# Patient Record
Sex: Male | Born: 1950 | Race: White | Hispanic: No | Marital: Married | Smoking: Never smoker
Health system: Southern US, Community
[De-identification: ages and names within clinical notes are randomized; demographics above are authoritative.]

## PROBLEM LIST (undated history)

## (undated) DIAGNOSIS — G44009 Cluster headache syndrome, unspecified, not intractable: Secondary | ICD-10-CM

## (undated) DIAGNOSIS — E611 Iron deficiency: Secondary | ICD-10-CM

## (undated) DIAGNOSIS — N200 Calculus of kidney: Secondary | ICD-10-CM

## (undated) DIAGNOSIS — J189 Pneumonia, unspecified organism: Secondary | ICD-10-CM

## (undated) DIAGNOSIS — I10 Essential (primary) hypertension: Secondary | ICD-10-CM

## (undated) DIAGNOSIS — R351 Nocturia: Secondary | ICD-10-CM

## (undated) DIAGNOSIS — K219 Gastro-esophageal reflux disease without esophagitis: Secondary | ICD-10-CM

## (undated) DIAGNOSIS — Z87442 Personal history of urinary calculi: Secondary | ICD-10-CM

## (undated) DIAGNOSIS — I6521 Occlusion and stenosis of right carotid artery: Secondary | ICD-10-CM

## (undated) DIAGNOSIS — E782 Mixed hyperlipidemia: Secondary | ICD-10-CM

## (undated) DIAGNOSIS — G4730 Sleep apnea, unspecified: Secondary | ICD-10-CM

## (undated) DIAGNOSIS — M1990 Unspecified osteoarthritis, unspecified site: Secondary | ICD-10-CM

## (undated) DIAGNOSIS — Z9889 Other specified postprocedural states: Secondary | ICD-10-CM

## (undated) DIAGNOSIS — R7989 Other specified abnormal findings of blood chemistry: Secondary | ICD-10-CM

## (undated) DIAGNOSIS — C61 Malignant neoplasm of prostate: Secondary | ICD-10-CM

## (undated) DIAGNOSIS — G4733 Obstructive sleep apnea (adult) (pediatric): Secondary | ICD-10-CM

## (undated) DIAGNOSIS — E785 Hyperlipidemia, unspecified: Secondary | ICD-10-CM

## (undated) DIAGNOSIS — E559 Vitamin D deficiency, unspecified: Secondary | ICD-10-CM

## (undated) DIAGNOSIS — I6523 Occlusion and stenosis of bilateral carotid arteries: Secondary | ICD-10-CM

## (undated) HISTORY — PX: COLONOSCOPY: SHX174

## (undated) HISTORY — PX: CYSTOSCOPY/RETROGRADE/URETEROSCOPY/STONE EXTRACTION WITH BASKET: SHX5317

## (undated) HISTORY — DX: Sleep apnea, unspecified: G47.30

## (undated) HISTORY — DX: Essential (primary) hypertension: I10

## (undated) HISTORY — DX: Gastro-esophageal reflux disease without esophagitis: K21.9

## (undated) HISTORY — PX: APPENDECTOMY: SHX54

## (undated) HISTORY — PX: PROSTATE BIOPSY: SHX241

## (undated) HISTORY — PX: UMBILICAL HERNIA REPAIR: SHX196

## (undated) HISTORY — DX: Vitamin D deficiency, unspecified: E55.9

## (undated) HISTORY — PX: INGUINAL HERNIA REPAIR: SHX194

## (undated) HISTORY — DX: Cluster headache syndrome, unspecified, not intractable: G44.009

## (undated) HISTORY — DX: Hyperlipidemia, unspecified: E78.5

## (undated) HISTORY — PX: ANTERIOR CERVICAL DECOMP/DISCECTOMY FUSION: SHX1161

## (undated) HISTORY — DX: Occlusion and stenosis of right carotid artery: I65.21

## (undated) HISTORY — DX: Other specified postprocedural states: Z98.890

---

## 1998-10-17 ENCOUNTER — Ambulatory Visit: Admission: RE | Admit: 1998-10-17 | Discharge: 1998-10-17 | Payer: Self-pay | Admitting: Otolaryngology

## 1998-12-08 ENCOUNTER — Ambulatory Visit (HOSPITAL_COMMUNITY): Admission: RE | Admit: 1998-12-08 | Discharge: 1998-12-08 | Payer: Self-pay | Admitting: Family Medicine

## 1998-12-28 ENCOUNTER — Ambulatory Visit: Admission: RE | Admit: 1998-12-28 | Discharge: 1998-12-28 | Payer: Self-pay | Admitting: Otolaryngology

## 2003-10-29 HISTORY — PX: ANTERIOR CERVICAL DISCECTOMY: SHX1160

## 2003-11-19 ENCOUNTER — Inpatient Hospital Stay (HOSPITAL_COMMUNITY): Admission: RE | Admit: 2003-11-19 | Discharge: 2003-11-20 | Payer: Self-pay | Admitting: Neurosurgery

## 2004-01-14 ENCOUNTER — Ambulatory Visit (HOSPITAL_COMMUNITY): Admission: RE | Admit: 2004-01-14 | Discharge: 2004-01-14 | Payer: Self-pay | Admitting: Gastroenterology

## 2004-01-14 ENCOUNTER — Encounter (INDEPENDENT_AMBULATORY_CARE_PROVIDER_SITE_OTHER): Payer: Self-pay | Admitting: Specialist

## 2004-06-08 ENCOUNTER — Ambulatory Visit (HOSPITAL_COMMUNITY): Admission: RE | Admit: 2004-06-08 | Discharge: 2004-06-08 | Payer: Self-pay | Admitting: Family Medicine

## 2006-09-25 ENCOUNTER — Ambulatory Visit (HOSPITAL_COMMUNITY): Admission: RE | Admit: 2006-09-25 | Discharge: 2006-09-25 | Payer: Self-pay | Admitting: Family Medicine

## 2006-09-25 ENCOUNTER — Ambulatory Visit: Payer: Self-pay | Admitting: Vascular Surgery

## 2006-09-25 ENCOUNTER — Encounter: Payer: Self-pay | Admitting: Vascular Surgery

## 2007-10-29 HISTORY — PX: UMBILICAL HERNIA REPAIR: SHX196

## 2007-11-06 ENCOUNTER — Ambulatory Visit (HOSPITAL_COMMUNITY): Admission: RE | Admit: 2007-11-06 | Discharge: 2007-11-06 | Payer: Self-pay | Admitting: Surgery

## 2008-10-05 ENCOUNTER — Ambulatory Visit (HOSPITAL_BASED_OUTPATIENT_CLINIC_OR_DEPARTMENT_OTHER): Admission: RE | Admit: 2008-10-05 | Discharge: 2008-10-05 | Payer: Self-pay | Admitting: Surgery

## 2008-10-05 HISTORY — PX: INGUINAL HERNIA REPAIR: SHX194

## 2009-12-22 ENCOUNTER — Encounter: Admission: RE | Admit: 2009-12-22 | Discharge: 2009-12-22 | Payer: Self-pay | Admitting: Family Medicine

## 2010-11-09 LAB — BASIC METABOLIC PANEL
CO2: 27 mEq/L (ref 19–32)
Calcium: 9.1 mg/dL (ref 8.4–10.5)
Creatinine, Ser: 0.94 mg/dL (ref 0.4–1.5)
Glucose, Bld: 112 mg/dL — ABNORMAL HIGH (ref 70–99)
Sodium: 142 mEq/L (ref 135–145)

## 2010-11-09 LAB — POCT HEMOGLOBIN-HEMACUE: Hemoglobin: 15.6 g/dL (ref 13.0–17.0)

## 2010-12-12 NOTE — Op Note (Signed)
NAME:  MAZE, CORNIEL NO.:  [id number]   MEDICAL RECORD NO.:  [id number]          PATIENT TYPE:  AMB   LOCATION:  DSC                          FACILITY:  [facility]   PHYSICIAN:  Currie Paris, M.D.DATE OF BIRTH:  01-04-1951   DATE OF PROCEDURE:  10/05/2008  DATE OF DISCHARGE:                               OPERATIVE REPORT   PREOPERATIVE DIAGNOSIS:  Direct right inguinal hernia.   POSTOPERATIVE DIAGNOSIS:  Direct right inguinal hernia.   OPERATION:  Repair of right inguinal hernia with mesh.   SURGEON:  Currie Paris, MD   ANESTHESIA:  General.   CLINICAL HISTORY:  This is a 60 year old gentleman with a recently  developed right inguinal hernia, which was reducible, but fairly large  and he wish to have repaired.   DESCRIPTION OF PROCEDURE:  I saw the patient in the holding area and we  reviewed the plans for the procedure as noted above.  He had no further  questions.  We both initialed the right side as the operative side.   The patient was taken to the operating room and after satisfactory  general anesthesia had been obtained, the groin area was clipped,  prepped, and draped.  The time-out was done.   To help with postop analgesia, I infiltrated both the skin incision line  and the area near the anterior superior iliac spine with some 0.25%  plain Marcaine.   Incision was made deep into the external oblique aponeurosis.  There is  a large amount of tissue bulging through the superficial ring.   I opened the external oblique aponeurosis and freed it up.  I was unable  to elevate the cord structures off of the deeper tissues and surrounded  with a Penrose drain.  There was a large amount of material up into the  sac and as I was stripping this off, I found that he had a large direct  defect with the direct tissue all stuck and involved with the cord, but  once I had that all stripped off, I could see that the defect was all  medial to the  epigastric whichI well able to visualize.  There was no  indirect sac and once I had everything stripped off, I had only the cord  structures coming through the deep ring.   I put more local in the deeper tissues and then used a running 2-0  Prolene to close the transversalis to keep the hernia reduced.  This was  done with no tension.  I then put a piece of Bard mesh in, which was  tapered medially and cut laterally to go around the cord.  This was  anchored to the pubic tubercle and then a running suture of 2-0 Prolene  along the shelving edge until I got to the deep ring.  It was then  tacked well up under the external oblique and onto the internal oblique  with interrupted Prolene.  The tails were crossed and tacked down.  What  I thought was the ilioinguinal nerve came out with the cord structures  and was preserved.  However, it  was difficult to visualize this for  certain.   I put more local in and then closed the external oblique with 3-0  Vicryl, Scarpa's with 3-0 Vicryl, and the skin with 4-0 Monocryl  subcuticular plus Dermabond.   The patient tolerated the procedure well and there were no operative  complications.  All counts were correct.      Currie Paris, M.D.  Electronically Signed     CJS/MEDQ  D:  10/05/2008  T:  10/05/2008  Job:  [id number]

## 2010-12-12 NOTE — Op Note (Signed)
NAME:  Kyle English, BORN NO.:  [id number]   MEDICAL RECORD NO.:  [id number]          PATIENT TYPE:  AMB   LOCATION:  DAY                          FACILITY:  [facility]   PHYSICIAN:  Currie Paris, M.D.DATE OF BIRTH:  1951-01-28   DATE OF PROCEDURE:  11/06/2007  DATE OF DISCHARGE:                               OPERATIVE REPORT   OFFICE MEDICAL:  [facility] [phone].   PREOPERATIVE DIAGNOSIS:  Umbilical hernia.   POSTOPERATIVE DIAGNOSIS:  Umbilical hernia.   OPERATION:  Repair of umbilical hernia.   SURGEON:  Currie Paris, M.D.   ANESTHESIA:  General.   CLINICAL HISTORY:  This is a 60 year old gentleman with a symptomatic  umbilical hernia.   DESCRIPTION OF PROCEDURE:  The patient was seen in the holding area and  had no further questions.  We confirmed umbilical hernia as the planned  procedure.   The patient was taken to the operating room and after satisfactory  general (LMA) anesthesia had been obtained, the abdomen was prepped and  draped.  The time-out was done.   I infiltrated 0.25% plain Marcaine around the umbilical area.  I made a  curvilinear incision at the base the umbilicus and as soon as we got  through the skin, I was into the hernia sac and saw omentum.  This was  cleaned off and the skin elevated off of the fascia circumferentially so  that I could see the defect nicely.  Careful palpation showed no  additional defects.   The incision was then closed by taking some Prolene and closing the  fascial defect with three sutures.  These tied down easily and with no  tension.  I then took about a 1-inch x 1-inch piece of Bard mesh and  threaded the Prolene sutures that I had used to close the fascia through  that and anchored them to that, and then several other Prolenes to  anchor the mesh to the fascia circumferentially to reinforce this.   Everything appeared to be dry.  Additional local was infiltrated as I  worked.  The subcutaneous was  closed with some 3-0 Vicryl and the skin  with a 4-0 Monocryl subcuticular and Dermabond.  The patient tolerated  procedure well.  There no complications.  All counts were correct.      Currie Paris, M.D.  Electronically Signed     CJS/MEDQ  D:  11/06/2007  T:  11/06/2007  Job:  [id number]   cc:   Duncan Dull, M.D.  Fax: [phone]

## 2010-12-15 NOTE — Op Note (Signed)
NAME:  Kyle English, Kyle English NO.:  [id number]   MEDICAL RECORD NO.:  [id number]                   PATIENT TYPE:  AMB   LOCATION:  ENDO                                 FACILITY:  [facility]   PHYSICIAN:  Petra Kuba, M.D.                 DATE OF BIRTH:  1950/09/17   DATE OF PROCEDURE:  01/14/2004  DATE OF DISCHARGE:                                 OPERATIVE REPORT   PROCEDURE:  Colonoscopy.   INDICATIONS FOR PROCEDURE:  Screening.   Consent was signed after risks, benefits, methods, and options were  thoroughly discussed in the office.   MEDICINES USED:  Demerol 60, Versed 6.   DESCRIPTION OF PROCEDURE:  Rectal inspection was pertinent for small  external hemorrhoids. Digital exam was negative. The video adjustable  colonoscope was inserted, fairly easily advanced around the colon to the  cecum. This did require rolling him on his back and abdominal pressure.  On  insertion in the mid descending, a small polyp was seen and also a rare  right sided diverticula was seen.  No other abnormalities were seen on  insertion.  The cecum was identified by the appendiceal orifice and the  ileocecal valve, the scope was slowly withdrawn. The prep was adequate.  There was some liquid stool that required washing and suctioning.  On fold  back from the cecum along the fold a sessile polyp was seen and was hot  biopsied x2 and put in the first container. The scope was slowly withdrawn.  No additional findings were seen as we withdrew back to the polyp seen on  insertion in the descending colon which was snared, electrocautery applied  and the polyp was suctioned through the scope and collected in the trap. The  scope was further withdrawn. In the distal sigmoid, another small polyp was  seen, snared, electrocautery was applied and the polyp was suctioned through  the scope and collected in the trap after removal and put in the same  container.  Two other tiny distal sigmoid  and rectal polyps were seen and  were each hot biopsied x1 and put in the same container. Anal rectal  pullthrough and retroflexion confirmed some small hemorrhoids. The scope was  reinserted a short ways up the left side of the colon, air was suctioned,  scope removed. The patient tolerated the procedure well. There was no  obvious or immediate complications.   ENDOSCOPIC DIAGNOSIS:  1. Internal and external hemorrhoids.  2. Questionable tiny right sided diverticula.  3. Descending sessile polyp hot biopsied.  4. Descending small polyp in the distal sigmoid. Small polyp was snared.  5. Hot biopsy of a tiny rectal and distal sigmoid polyp.  6. Otherwise within normal limits to the cecum.   PLAN:  Await pathology to determine future colonic screening. Happy to see  back p.r.n. otherwise return care to Dr. Kevan Ny for the customary health care  maintenance  to include yearly rectals and guaiacs.                                               Petra Kuba, M.D.    MEM/MEDQ  D:  01/14/2004  T:  01/14/2004  Job:  [id number]   cc:   Duncan Dull, M.D.  [address]  [place]  [place] [address]  Fax: [phone]

## 2010-12-15 NOTE — Op Note (Signed)
NAME:  Kyle English, Kyle English NO.:  [id number]   MEDICAL RECORD NO.:  [id number]                   PATIENT TYPE:  INP   LOCATION:  2899                                 FACILITY:  [facility]   PHYSICIAN:  Coletta Memos, M.D.                  DATE OF BIRTH:  Jul 12, 1951   DATE OF PROCEDURE:  11/19/2003  DATE OF DISCHARGE:                                 OPERATIVE REPORT   PREOPERATIVE DIAGNOSIS:  1. 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  2. Cervical radiculopathy.   POSTOPERATIVE DIAGNOSIS:  1. 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  2. Cervical radiculopathy.   OPERATION PERFORMED:  Anterior cervical decompression C5-6, C6-7 with  arthrodesis C5 to C7.  Anterior plating C5 to C7.  37 mm Synthes small  stature plate and two 7 mm Synthes grafts using 1 cc of DBX putty.   SURGEON:  Coletta Memos, M.D.   ASSISTANT:  Hilda Lias, M.D.   ANESTHESIA:  General.   COMPLICATIONS:  None.   INDICATIONS FOR PROCEDURE:  The patient is a 60 year old gentleman who  reports right hand pain and numbness along with tingling of the thumb and  index finger.  On examination, he also has significant weakness in the  triceps on the right side, finger extensors and wrist extensors.  I  therefore recommended and he agreed to undergo operative decompression for  his problem.   DESCRIPTION OF PROCEDURE:  Kyle English was brought to the operating room  intubated and placed under general anesthetic without difficulty.  He was  positioned with his head in slight extension on a horseshoe headrest with 10  pounds of traction applied via chin strap.  His neck was prepped and he was  draped in a sterile fashion.  I infiltrated 3 mL 0.5% lidocaine 1:200,000  strength epinephrine.  Skin was opened with a #10 blade and I took that  incision down to the platysma.  I dissected rostral and caudally superior to  the platysma.  I opened the platysma in  a horizontal fashion and then  dissected rostrally and caudally inferior to the platysma.  I identified the  medial strap muscles and the sternocleidomastoid.  I coagulated and divided  the vein sharply there.  I was then able to retract the medial strap muscles  and create an avascular plate to the anterior spine.  I took an x-ray after  placing a spinal needle into the disk space I had exposed.  That was at C5-  6.  Then using that as a guide, I had exposed C5-6 and C6-7.  I reflected  the longus colli muscles bilaterally.  I closed the self-retaining  retractor.  I placed the distraction in C5, one in C6, then I proceeded with  a diskectomy at C5-6.  I opened the disk space with a #15 blade.  Then using  curets and pituitary rongeur, high speed air  drill and Kerrison punches, I  was able to remove the disk material and decompress the C6 nerve roots  bilaterally.  This was done with microscopic dissection.  After that was  completed, I then placed a 7 mm graft into that space.  I removed the  distraction pins at C5 and C6.  I repositioned the Caspar retractor  overlying the C6-7 disk space.  I then placed the distraction pins again in  the same hole with C6 and then one now at C7.  I opened the disk space and  distracted it also.  Using pituitary rongeurs, Kerrison punch, high speed  air drill I removed osteophytes and disk material.  I was able to then  achieve excellent decompression of the C7 nerve roots bilaterally.  I  irrigated the wound.  Dr.  Jeral English assisted with the decompression.  We then  placed a 7 mm graft at that level.  This was filled with the other 0.5 cc of  the DBX putty.  I then removed the distraction pins.  I then placed a plate  putting two screws each at C5, C6, C7, each screw first placed by drilling a  hole then tapping, then placing the screw.  Locking screws were placed.  X-  ray showed the plates, screws and plugs to be in good position.  I then  irrigated the  wound once more,  I then closed the wound in layered fashion  using Vicryl sutures.  Dermabond was used for sterile dressing.  The patient  tolerated the procedure well.                                               Coletta Memos, M.D.    KC/MEDQ  D:  11/19/2003  T:  11/21/2003  Job:  [id number]

## 2011-04-24 LAB — BASIC METABOLIC PANEL
BUN: 10
Calcium: 8.9
Chloride: 105
GFR calc non Af Amer: >60
Glucose, Bld: 114 — ABNORMAL HIGH
Potassium: 3.6
Sodium: 138

## 2011-04-24 LAB — HEMOGLOBIN AND HEMATOCRIT, BLOOD: Hemoglobin: 15.5

## 2012-04-02 ENCOUNTER — Other Ambulatory Visit: Payer: Self-pay | Admitting: Dermatology

## 2013-05-21 ENCOUNTER — Other Ambulatory Visit: Payer: Self-pay | Admitting: Gastroenterology

## 2015-10-05 DIAGNOSIS — I10 Essential (primary) hypertension: Secondary | ICD-10-CM | POA: Diagnosis not present

## 2015-10-05 DIAGNOSIS — E782 Mixed hyperlipidemia: Secondary | ICD-10-CM | POA: Diagnosis not present

## 2015-10-05 DIAGNOSIS — E559 Vitamin D deficiency, unspecified: Secondary | ICD-10-CM | POA: Diagnosis not present

## 2016-04-05 DIAGNOSIS — E782 Mixed hyperlipidemia: Secondary | ICD-10-CM | POA: Diagnosis not present

## 2016-04-05 DIAGNOSIS — Z23 Encounter for immunization: Secondary | ICD-10-CM | POA: Diagnosis not present

## 2016-04-05 DIAGNOSIS — Z125 Encounter for screening for malignant neoplasm of prostate: Secondary | ICD-10-CM | POA: Diagnosis not present

## 2016-04-05 DIAGNOSIS — I6521 Occlusion and stenosis of right carotid artery: Secondary | ICD-10-CM | POA: Diagnosis not present

## 2016-04-05 DIAGNOSIS — I10 Essential (primary) hypertension: Secondary | ICD-10-CM | POA: Diagnosis not present

## 2016-04-05 DIAGNOSIS — E559 Vitamin D deficiency, unspecified: Secondary | ICD-10-CM | POA: Diagnosis not present

## 2016-04-05 DIAGNOSIS — Z0000 Encounter for general adult medical examination without abnormal findings: Secondary | ICD-10-CM | POA: Diagnosis not present

## 2016-04-06 ENCOUNTER — Other Ambulatory Visit: Payer: Self-pay | Admitting: Family Medicine

## 2016-04-06 DIAGNOSIS — I6521 Occlusion and stenosis of right carotid artery: Secondary | ICD-10-CM

## 2016-04-11 ENCOUNTER — Ambulatory Visit
Admission: RE | Admit: 2016-04-11 | Discharge: 2016-04-11 | Disposition: A | Discharge status: Home / Self Care | Payer: PPO | Source: Ambulatory Visit | Attending: Family Medicine | Admitting: Family Medicine

## 2016-04-11 DIAGNOSIS — I6523 Occlusion and stenosis of bilateral carotid arteries: Secondary | ICD-10-CM | POA: Diagnosis not present

## 2016-04-11 DIAGNOSIS — I6521 Occlusion and stenosis of right carotid artery: Secondary | ICD-10-CM

## 2016-04-12 ENCOUNTER — Encounter: Payer: Self-pay | Admitting: Vascular Surgery

## 2016-04-12 ENCOUNTER — Other Ambulatory Visit: Payer: Self-pay | Admitting: *Deleted

## 2016-04-12 DIAGNOSIS — I6523 Occlusion and stenosis of bilateral carotid arteries: Secondary | ICD-10-CM

## 2016-04-13 ENCOUNTER — Other Ambulatory Visit: Payer: Self-pay | Admitting: *Deleted

## 2016-04-13 ENCOUNTER — Ambulatory Visit (HOSPITAL_COMMUNITY)
Admission: RE | Admit: 2016-04-13 | Discharge: 2016-04-13 | Disposition: A | Discharge status: Home / Self Care | Payer: PPO | Source: Ambulatory Visit | Attending: Vascular Surgery | Admitting: Vascular Surgery

## 2016-04-13 ENCOUNTER — Ambulatory Visit (INDEPENDENT_AMBULATORY_CARE_PROVIDER_SITE_OTHER): Payer: PPO | Admitting: Vascular Surgery

## 2016-04-13 ENCOUNTER — Encounter: Payer: Self-pay | Admitting: Vascular Surgery

## 2016-04-13 VITALS — BP 144/82 | HR 84 | Temp 99.3°F | Resp 18 | Ht 71.0 in | Wt 217.6 lb

## 2016-04-13 DIAGNOSIS — I6523 Occlusion and stenosis of bilateral carotid arteries: Secondary | ICD-10-CM

## 2016-04-13 DIAGNOSIS — I6521 Occlusion and stenosis of right carotid artery: Secondary | ICD-10-CM | POA: Diagnosis not present

## 2016-04-13 DIAGNOSIS — Z01810 Encounter for preprocedural cardiovascular examination: Secondary | ICD-10-CM

## 2016-04-13 LAB — VAS US CAROTID
RCCAPDIAS: 12 cm/s
RIGHT CCA MID DIAS: 15 cm/s
RIGHT ECA DIAS: -26 cm/s
RIGHT VERTEBRAL DIAS: 30 cm/s
Right CCA prox sys: 66 cm/s
Right cca dist sys: -434 cm/s

## 2016-04-13 NOTE — Progress Notes (Signed)
Vitals:   04/13/16 1230 04/13/16 1233 04/13/16 1235  BP: (!) 144/91 (!) 146/88 (!) 143/84  Pulse: 84    Resp: 18    Temp: 99.3 F (37.4 C)    TempSrc: Oral    SpO2: 98%    Weight: 217 lb 9.6 oz (98.7 kg)    Height: 5\' 11"  (1.803 m)

## 2016-04-13 NOTE — Progress Notes (Signed)
Patient ID: Kyle English, male   DOB: 1951/02/22, 65 y.o.   MRN: [id number]  Reason for Consult: New Evaluation (referred from Dr. Darcus Austin,  Carotid duplex (right) 04-13-2016 ) and Carotid   Referred by No ref. provider found  Subjective:     HPI:  Kyle English is a 65 y.o. male presents for evaluation of right carotid artery stenosis. He has history of ACDF. Neck incision otherwise has been mostly healthy. He denies any history of stroke TIA or amaurosis. He denies any history of pain. He has no limitations to his walking. He does take aspirin he does take a statin as directed by Dr. Inda Merlin. Recently had carotid duplex at her office which demonstrated high-grade stenosis of the right carotid with left internal carotid less than 50%. He is therefore referred to our office for further evaluation.  Past Medical History:  Diagnosis Date  . Chronic kidney disease   . Cluster headaches   . Esophageal reflux   . Hyperlipidemia   . Hypertension   . Sleep apnea   . Stenosis of right carotid artery   . Vitamin D deficiency    Family History  Problem Relation Age of Onset  . Diabetes Mother   . Hypertension Mother   . Cancer Father     prostate  . Hypertension Sister    Past Surgical History:  Procedure Laterality Date  . ANTERIOR CERVICAL DISCECTOMY  10/2003  . APPENDECTOMY    . COLONOSCOPY  2010, 2014  . INGUINAL HERNIA REPAIR Right 10/05/2008  . UMBILICAL HERNIA REPAIR  10/2007    Short Social History:  Social History  Substance Use Topics  . Smoking status: Never Smoker  . Smokeless tobacco: Never Used  . Alcohol use No    No Known Allergies  Current Outpatient Prescriptions  Medication Sig Dispense Refill  . aspirin 81 MG tablet 1 tablet    . atorvastatin (LIPITOR) 20 MG tablet 1 tablet    . dutasteride (AVODART) 0.5 MG capsule 1 capsule    . EPINEPHrine (EPIPEN 2-PAK) 0.3 mg/0.3 mL IJ SOAJ injection as directed    . Fish Oil-Cholecalciferol (FISH OIL  + D3) 1200-1000 MG-UNIT CAPS 1 capsule    . fluticasone (FLONASE) 50 MCG/ACT nasal spray 2 sprays in each nostril    . Glucos-Chondroit-Hyaluron-MSM (GLUCOSAMINE CHONDROITIN JOINT) TABS 1 tablet    . Multiple Vitamins-Minerals (MULTI ADULT GUMMIES) CHEW 2 Gummies    . omeprazole (PRILOSEC OTC) 20 MG tablet 1 tablet    . sildenafil (REVATIO) 20 MG tablet 2-5 tablets    . valsartan (DIOVAN) 320 MG tablet 1 tablet    . verapamil (CALAN-SR) 240 MG CR tablet 1 tablet    . Vitamin D, Ergocalciferol, (DRISDOL) 50000 units CAPS capsule 1 capsule     No current facility-administered medications for this visit.     Review of Systems  Constitutional:  Constitutional negative. HENT: HENT negative.  Eyes: Eyes negative.  Respiratory: Respiratory negative.  Cardiovascular: Cardiovascular negative.  GI: Gastrointestinal negative.  Musculoskeletal: Musculoskeletal negative.  Skin: Skin negative.  Neurological: Neurological negative. Hematologic: Hematologic/lymphatic negative.  Psychiatric: Psychiatric negative.        Objective:  Objective   Vitals:   04/13/16 1230 04/13/16 1233 04/13/16 1235 04/13/16 1236  BP: (!) 144/91 (!) 146/88 (!) 143/84 (!) 144/82  Pulse: 84     Resp: 18     Temp: 99.3 F (37.4 C)     TempSrc: Oral     SpO2:  98%     Weight: 217 lb 9.6 oz (98.7 kg)     Height: 5\' 11"  (1.803 m)      Body mass index is 30.35 kg/m.  Physical Exam  Constitutional: He is oriented to person, place, and time. He appears well-developed.  HENT:  Head: Normocephalic.  Eyes: EOM are normal.  Neck: Normal range of motion.  Well healed neck incision  Cardiovascular: Normal rate and regular rhythm.   Pulses:      Carotid pulses are 2+ on the right side, and 2+ on the left side.      Radial pulses are 2+ on the right side, and 2+ on the left side.       Femoral pulses are 2+ on the right side, and 2+ on the left side.      Popliteal pulses are 2+ on the right side, and 2+ on the  left side.  Abdominal: Soft. He exhibits no mass.  Musculoskeletal: Normal range of motion. He exhibits no edema.  Neurological: He is alert and oriented to person, place, and time.  Skin: Skin is warm and dry.  Psychiatric: His behavior is normal. Judgment normal.    Data: Right carotid. Common carotid artery distal PSV 434 with EDV 141. Right ICA proximal PSV 599 with EDV 146.  Impression a 99% right internal carotid artery stenosis with location of bifurcation being mid to high.     Assessment/Plan:  65 year old white male with out significant medical history presents with asymptomatic high-grade right carotid stenosis. I recommended carotid endarterectomy and he will need cardiac clearance. I have discussed with him what a 2% risk of stroke 5% risk of cranial nerve injury risk of bleeding risk of infection risk of restenosis in the future. He is comforted by his wife today in agreement to proceed.     Waynetta Sandy MD [facility]

## 2016-04-18 ENCOUNTER — Encounter (HOSPITAL_COMMUNITY): Payer: Self-pay

## 2016-04-18 ENCOUNTER — Encounter (HOSPITAL_COMMUNITY)
Admission: RE | Admit: 2016-04-18 | Discharge: 2016-04-18 | Disposition: A | Discharge status: Home / Self Care | Payer: PPO | Source: Ambulatory Visit | Attending: Vascular Surgery | Admitting: Vascular Surgery

## 2016-04-18 ENCOUNTER — Other Ambulatory Visit (HOSPITAL_COMMUNITY): Payer: Self-pay | Admitting: *Deleted

## 2016-04-18 DIAGNOSIS — Z01812 Encounter for preprocedural laboratory examination: Secondary | ICD-10-CM | POA: Insufficient documentation

## 2016-04-18 HISTORY — DX: Unspecified osteoarthritis, unspecified site: M19.90

## 2016-04-18 HISTORY — DX: Iron deficiency: E61.1

## 2016-04-18 HISTORY — DX: Pneumonia, unspecified organism: J18.9

## 2016-04-18 HISTORY — DX: Calculus of kidney: N20.0

## 2016-04-18 LAB — ABO/RH: ABO/RH(D): A POS

## 2016-04-18 LAB — COMPREHENSIVE METABOLIC PANEL
ALK PHOS: 87 U/L (ref 38–126)
ALT: 29 U/L (ref 17–63)
AST: 26 U/L (ref 15–41)
Albumin: 4.1 g/dL (ref 3.5–5.0)
Anion gap: 9 (ref 5–15)
BILIRUBIN TOTAL: 1.1 mg/dL (ref 0.3–1.2)
BUN: 10 mg/dL (ref 6–20)
CALCIUM: 9.4 mg/dL (ref 8.9–10.3)
CO2: 23 mmol/L (ref 22–32)
CREATININE: 1.17 mg/dL (ref 0.61–1.24)
Chloride: 108 mmol/L (ref 101–111)
Glucose, Bld: 99 mg/dL (ref 65–99)
Potassium: 4.3 mmol/L (ref 3.5–5.1)
Sodium: 140 mmol/L (ref 135–145)
Total Protein: 6.8 g/dL (ref 6.5–8.1)

## 2016-04-18 LAB — CBC
HEMATOCRIT: 43.4 % (ref 39.0–52.0)
HEMOGLOBIN: 15.2 g/dL (ref 13.0–17.0)
MCH: 32.3 pg (ref 26.0–34.0)
MCHC: 35 g/dL (ref 30.0–36.0)
MCV: 92.3 fL (ref 78.0–100.0)
Platelets: 160 10*3/uL (ref 150–400)
RBC: 4.7 MIL/uL (ref 4.22–5.81)
RDW: 12.8 % (ref 11.5–15.5)
WBC: 4 10*3/uL (ref 4.0–10.5)

## 2016-04-18 LAB — APTT: aPTT: 32 s (ref 24–36)

## 2016-04-18 LAB — TYPE AND SCREEN
ABO/RH(D): A POS
Antibody Screen: NEGATIVE

## 2016-04-18 LAB — PROTIME-INR
INR: 0.99
PROTHROMBIN TIME: 13.1 s (ref 11.4–15.2)

## 2016-04-18 LAB — SURGICAL PCR SCREEN
MRSA, PCR: NEGATIVE
Staphylococcus aureus: NEGATIVE

## 2016-04-18 NOTE — Pre-Procedure Instructions (Signed)
Kyle English [facility]  04/18/2016    Your procedure is scheduled on Monday, April 23, 2016 at 7:30 AM.   Report to [facility] Entrance "A" Admitting Office at 5:30 AM.   Call this number if you have problems the morning of surgery: [phone]   Questions prior to day of surgery, please call [phone] between 8 & 4 PM.   Remember:  Do not eat food or drink liquids after midnight Sunday, 04/22/16.  Take these medicines the morning of surgery with A SIP OF WATER: Aspirin, Omeprazole (Prilosec), Verapamil (Calan-SR)  Stop NSAIDS (Ibuprofen, Aleve, etc.), Multivitamins, Fish Oil and Herbal medications as of today.   Do not wear jewelry.  Do not wear lotions, powders, or cologne, or deodorant.  Men may shave face and neck.  Do not bring valuables to the hospital.  Kyle English is not responsible for any belongings or valuables.  Contacts, dentures or bridgework may not be worn into surgery.  Leave your suitcase in the car.  After surgery it may be brought to your room.  For patients admitted to the hospital, discharge time will be determined by your treatment team.  Special instructions:  [place] - Preparing for Surgery  Before surgery, you can play an important role.  Because skin is not sterile, your skin needs to be as free of germs as possible.  You can reduce the number of germs on you skin by washing with CHG (chlorahexidine gluconate) soap before surgery.  CHG is an antiseptic cleaner which kills germs and bonds with the skin to continue killing germs even after washing.  Please DO NOT use if you have an allergy to CHG or antibacterial soaps.  If your skin becomes reddened/irritated stop using the CHG and inform your nurse when you arrive at Short Stay.  Do not shave (including legs and underarms) for at least 48 hours prior to the first CHG shower.  You may shave your face.  Please follow these instructions carefully:   1.  Shower with CHG Soap the night  before surgery and the                    morning of Surgery.  2.  If you choose to wash your hair, wash your hair first as usual with your       normal shampoo.  3.  After you shampoo, rinse your hair and body thoroughly to remove the shampoo.  4.  Use CHG as you would any other liquid soap.  You can apply chg directly       to the skin and wash gently with scrungie or a clean washcloth.  5.  Apply the CHG Soap to your body ONLY FROM THE NECK DOWN.        Do not use on open wounds or open sores.  Avoid contact with your eyes, ears, mouth and genitals (private parts).  Wash genitals (private parts) with your normal soap.  6.  Wash thoroughly, paying special attention to the area where your surgery        will be performed.  7.  Thoroughly rinse your body with warm water from the neck down.  8.  DO NOT shower/wash with your normal soap after using and rinsing off       the CHG Soap.  9.  Pat yourself dry with a clean towel.            10 .  Wear clean pajamas.  11.  Place clean sheets on your bed the night of your first shower and do not        sleep with pets.  Day of Surgery  Do not apply any lotions/deodorants the morning of surgery.  Please wear clean clothes to the hospital.   Please read over the following fact sheets that you were given. Pain Booklet, Coughing and Deep Breathing, MRSA Information and Surgical Site Infection Prevention

## 2016-04-18 NOTE — Progress Notes (Signed)
Pt denies cardiac history, chest pain or sob. Pt has appt on 04/20/16 with Dr. Jacinta Shoe for cardiac clearance.

## 2016-04-19 NOTE — Progress Notes (Addendum)
Anesthesia Chart Review:  Pt is a 65 year old male scheduled for R CEA on 04/23/2016 with Servando Snare, MD.   PMH includes:  HTN, hyperlipidemia, OSA, carotid stenosis, GERD.  Never smoker. BMI 31  Medications include: ASA, lipitor, prilosec, sildenafil, valsartan, verapamil.    Preoperative labs reviewed.    EKG 04/05/16: sinus rhythm  Carotid duplex 04/11/16:  - Atherosclerotic disease in bilateral carotid arteries.  - Large amount of irregular plaque at the right carotid bulb and proximal right ICA causing a high-grade or critical stenosis at this location. Estimated degree of stenosis in the proximal right ICA is greater than 70%. - Estimated degree of stenosis in the left internal carotid artery is less than 50%.  Pt has appt for cardiac pre-op eval 04/20/16. Will revisit chart when notes available.   Willeen Cass, FNP-BC [facility] Short Stay Surgical [facility]/Anesthesiology Phone: [phone] 04/19/2016 1:36 PM   Addendum: Pt saw Kate Sable, MD with cardiology for pre-op eval on 04/20/16 and was cleared for surgery at low risk without further testing.   If no changes, I anticipate pt can proceed with surgery as scheduled.   Willeen Cass, FNP-BC [facility] Short Stay Surgical Center/Anesthesiology Phone: [phone] 04/20/2016 4:07 PM

## 2016-04-20 ENCOUNTER — Encounter: Payer: Self-pay | Admitting: Cardiovascular Disease

## 2016-04-20 ENCOUNTER — Ambulatory Visit (INDEPENDENT_AMBULATORY_CARE_PROVIDER_SITE_OTHER): Payer: PPO | Admitting: Cardiovascular Disease

## 2016-04-20 VITALS — BP 134/74 | HR 90 | Ht 70.0 in | Wt 212.0 lb

## 2016-04-20 DIAGNOSIS — Z01818 Encounter for other preprocedural examination: Secondary | ICD-10-CM

## 2016-04-20 DIAGNOSIS — I6523 Occlusion and stenosis of bilateral carotid arteries: Secondary | ICD-10-CM | POA: Diagnosis not present

## 2016-04-20 DIAGNOSIS — E785 Hyperlipidemia, unspecified: Secondary | ICD-10-CM | POA: Diagnosis not present

## 2016-04-20 DIAGNOSIS — I10 Essential (primary) hypertension: Secondary | ICD-10-CM | POA: Diagnosis not present

## 2016-04-20 NOTE — Patient Instructions (Signed)
Your physician recommends that you schedule a follow-up appointment in: as needed   Thank you for choosing [facility] !         

## 2016-04-20 NOTE — Progress Notes (Signed)
CARDIOLOGY CONSULT NOTE  Patient ID: Kyle English MRN: [id number] DOB/AGE: 1951-04-01 65 y.o.  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 Presents for preoperative risk stratification.  The patient denies any symptoms of chest pain, palpitations, shortness of breath, lightheadedness, dizziness, leg swelling, orthopnea, PND, and syncope.  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  ECG today shows NSR with late R wave transition.   No Known Allergies  Current Outpatient Prescriptions  Medication Sig Dispense Refill  . aspirin 81 MG tablet take 1 tablet by mouth once daily at bedtime    . atorvastatin (LIPITOR) 20 MG tablet take 1 tablet by mouth in the evening    . dutasteride (AVODART) 0.5 MG capsule take 1 cap by mouth every evening    . EPINEPHrine (EPIPEN 2-PAK) 0.3 mg/0.3 mL IJ SOAJ injection use once as needed for allergic reaction as directed by physician    . Fish Oil-Cholecalciferol (FISH OIL + D3) 1200-1000 MG-UNIT CAPS take 1 cap by mouth daily    . fluticasone (FLONASE) 50 MCG/ACT nasal spray use 2 sprays in each nostril daily as needed for allergies    . Glucos-Chondroit-Hyaluron-MSM (GLUCOSAMINE CHONDROITIN JOINT) TABS take 1 tab by mouth twice daily    . ibuprofen (ADVIL,MOTRIN) 200 MG tablet Take 200 mg by mouth every 6 (six) hours as needed for moderate pain.    . Multiple Vitamins-Minerals (MULTI ADULT GUMMIES) CHEW take 2 chewable tablets by mouth daily    . omeprazole (PRILOSEC OTC) 20 MG tablet take 1 tablet by mouth every morning    . sildenafil (REVATIO) 20 MG tablet take 2-5 tablets by mouth once as needed for erectile dysfunction    . valsartan (DIOVAN) 320 MG tablet take 1 tablet by mouth every morning    . verapamil  (CALAN-SR) 240 MG CR tablet take 1 tablet by mouth every morning    . Vitamin D, Ergocalciferol, (DRISDOL) 50000 units CAPS capsule take 1 cap by mouth every other week     No current facility-administered medications for this visit.     Past Medical History:  Diagnosis Date  . Arthritis   . Cluster headaches    hx of cluster headaches, takes Verapamil  . Esophageal reflux   . Hyperlipidemia   . Hypertension   . Kidney stones   . Low iron    10 years ago  . Pneumonia   . Sleep apnea    uses cpap  . Stenosis of right carotid artery   . Vitamin D deficiency     Past Surgical History:  Procedure Laterality Date  . ANTERIOR CERVICAL DISCECTOMY  10/2003  . APPENDECTOMY    . COLONOSCOPY  2010, 2014  . INGUINAL HERNIA REPAIR Right 10/05/2008  . UMBILICAL HERNIA REPAIR  10/2007    Social History   Social History  . Marital status: Married    Spouse name: N/A  . Number of children: N/A  . Years of education: N/A   Occupational History  . Not on file.   Social History Main Topics  . Smoking status: Never Smoker  . Smokeless tobacco: Never Used  . Alcohol use No  . Drug use: No  . Sexual activity: Not on file   Other Topics Concern  . Not on file  Social History Narrative  . No narrative on file     No family history of premature CAD in 1st degree relatives.  Prior to Admission medications   Medication Sig Start Date End Date Taking? Authorizing Provider  aspirin 81 MG tablet take 1 tablet by mouth once daily at bedtime   Yes Historical Provider, MD  atorvastatin (LIPITOR) 20 MG tablet take 1 tablet by mouth in the evening 05/16/15  Yes Historical Provider, MD  dutasteride (AVODART) 0.5 MG capsule take 1 cap by mouth every evening 03/28/15  Yes Historical Provider, MD  EPINEPHrine (EPIPEN 2-PAK) 0.3 mg/0.3 mL IJ SOAJ injection use once as needed for allergic reaction as directed by physician 10/21/12  Yes Historical Provider, MD  Fish Oil-Cholecalciferol (FISH  OIL + D3) 1200-1000 MG-UNIT CAPS take 1 cap by mouth daily   Yes Historical Provider, MD  fluticasone (FLONASE) 50 MCG/ACT nasal spray use 2 sprays in each nostril daily as needed for allergies 09/10/13  Yes Historical Provider, MD  Glucos-Chondroit-Hyaluron-MSM (GLUCOSAMINE CHONDROITIN JOINT) TABS take 1 tab by mouth twice daily   Yes Historical Provider, MD  ibuprofen (ADVIL,MOTRIN) 200 MG tablet Take 200 mg by mouth every 6 (six) hours as needed for moderate pain.   Yes Historical Provider, MD  Multiple Vitamins-Minerals (MULTI ADULT GUMMIES) CHEW take 2 chewable tablets by mouth daily   Yes Historical Provider, MD  omeprazole (PRILOSEC OTC) 20 MG tablet take 1 tablet by mouth every morning   Yes Historical Provider, MD  sildenafil (REVATIO) 20 MG tablet take 2-5 tablets by mouth once as needed for erectile dysfunction 03/17/14  Yes Historical Provider, MD  valsartan (DIOVAN) 320 MG tablet take 1 tablet by mouth every morning 10/26/13  Yes Historical Provider, MD  verapamil (CALAN-SR) 240 MG CR tablet take 1 tablet by mouth every morning 01/01/13  Yes Historical Provider, MD  Vitamin D, Ergocalciferol, (DRISDOL) 50000 units CAPS capsule take 1 cap by mouth every other week   Yes Historical Provider, MD     Review of systems complete and found to be negative unless listed above in HPI     Physical exam Blood pressure 134/74, pulse 90, height 5\' 10"  (1.778 m), weight 212 lb (96.2 kg), SpO2 98 %. General: NAD Neck: No JVD, no thyromegaly or thyroid nodule.  Lungs: Clear to auscultation bilaterally with normal respiratory effort. CV: Nondisplaced PMI. Regular rate and rhythm, normal S1/S2, no S3/S4, no murmur.  No peripheral edema.  Right carotid bruit.  Normal pedal pulses.  Abdomen: Soft, nontender, no hepatosplenomegaly, no distention.  Skin: Intact without lesions or rashes.  Neurologic: Alert and oriented x 3.  Psych: Normal affect. Extremities: No clubbing or cyanosis.  HEENT: Normal.    ECG: Most recent ECG reviewed.  Labs:   Lab Results  Component Value Date   WBC 4.0 04/18/2016   HGB 15.2 04/18/2016   HCT 43.4 04/18/2016   MCV 92.3 04/18/2016   PLT 160 04/18/2016     Recent Labs Lab 04/18/16 1050  NA 140  K 4.3  CL 108  CO2 23  BUN 10  CREATININE 1.17  CALCIUM 9.4  PROT 6.8  BILITOT 1.1  ALKPHOS 87  ALT 29  AST 26  GLUCOSE 99   No results found for: CKTOTAL, CKMB, CKMBINDEX, TROPONINI No results found for: CHOL No results found for: HDL No results found for: LDLCALC No results found for: TRIG No results found for: CHOLHDL No results found for: LDLDIRECT  Studies: No results found.  ASSESSMENT AND PLAN:  1. Preop risk stratification: Appears to be at low risk for major adverse cardiac events in the perioperative period. I do not feel noninvasive testing is warranted at this time. Continue ASA and statin for plaque stabilization.  2. HTN: Controlled on multiple meds. No changes.  3. Hyperlipidemia: Continue statin.  Dispo: fu to be determined.   Signed: Kate Sable, M.D., F.A.C.C.  04/20/2016, 1:28 PM

## 2016-04-23 ENCOUNTER — Inpatient Hospital Stay (HOSPITAL_COMMUNITY)
Admission: RE | Admit: 2016-04-23 | Discharge: 2016-04-24 | DRG: 039 | Disposition: A | Discharge status: Home / Self Care | Payer: PPO | Source: Ambulatory Visit | Attending: Vascular Surgery | Admitting: Vascular Surgery

## 2016-04-23 ENCOUNTER — Encounter (HOSPITAL_COMMUNITY): Payer: Self-pay | Admitting: *Deleted

## 2016-04-23 ENCOUNTER — Encounter (HOSPITAL_COMMUNITY)
Admission: RE | Disposition: A | Discharge status: Home / Self Care | Payer: Self-pay | Source: Ambulatory Visit | Attending: Vascular Surgery

## 2016-04-23 ENCOUNTER — Inpatient Hospital Stay (HOSPITAL_COMMUNITY): Payer: PPO | Admitting: Certified Registered Nurse Anesthetist

## 2016-04-23 ENCOUNTER — Inpatient Hospital Stay (HOSPITAL_COMMUNITY): Payer: PPO | Admitting: Emergency Medicine

## 2016-04-23 DIAGNOSIS — Z7982 Long term (current) use of aspirin: Secondary | ICD-10-CM | POA: Diagnosis not present

## 2016-04-23 DIAGNOSIS — Z8249 Family history of ischemic heart disease and other diseases of the circulatory system: Secondary | ICD-10-CM

## 2016-04-23 DIAGNOSIS — I739 Peripheral vascular disease, unspecified: Secondary | ICD-10-CM | POA: Diagnosis not present

## 2016-04-23 DIAGNOSIS — R51 Headache: Secondary | ICD-10-CM | POA: Diagnosis not present

## 2016-04-23 DIAGNOSIS — Z79899 Other long term (current) drug therapy: Secondary | ICD-10-CM | POA: Diagnosis not present

## 2016-04-23 DIAGNOSIS — M1990 Unspecified osteoarthritis, unspecified site: Secondary | ICD-10-CM | POA: Diagnosis present

## 2016-04-23 DIAGNOSIS — E7800 Pure hypercholesterolemia, unspecified: Secondary | ICD-10-CM | POA: Diagnosis present

## 2016-04-23 DIAGNOSIS — I6523 Occlusion and stenosis of bilateral carotid arteries: Principal | ICD-10-CM | POA: Diagnosis present

## 2016-04-23 DIAGNOSIS — Z9889 Other specified postprocedural states: Secondary | ICD-10-CM

## 2016-04-23 DIAGNOSIS — I6501 Occlusion and stenosis of right vertebral artery: Secondary | ICD-10-CM | POA: Diagnosis not present

## 2016-04-23 DIAGNOSIS — E559 Vitamin D deficiency, unspecified: Secondary | ICD-10-CM | POA: Diagnosis not present

## 2016-04-23 DIAGNOSIS — Z833 Family history of diabetes mellitus: Secondary | ICD-10-CM | POA: Diagnosis not present

## 2016-04-23 DIAGNOSIS — Z981 Arthrodesis status: Secondary | ICD-10-CM

## 2016-04-23 DIAGNOSIS — G4730 Sleep apnea, unspecified: Secondary | ICD-10-CM | POA: Diagnosis present

## 2016-04-23 DIAGNOSIS — I6521 Occlusion and stenosis of right carotid artery: Secondary | ICD-10-CM | POA: Diagnosis not present

## 2016-04-23 DIAGNOSIS — I10 Essential (primary) hypertension: Secondary | ICD-10-CM | POA: Diagnosis not present

## 2016-04-23 DIAGNOSIS — I6529 Occlusion and stenosis of unspecified carotid artery: Secondary | ICD-10-CM | POA: Diagnosis present

## 2016-04-23 DIAGNOSIS — K219 Gastro-esophageal reflux disease without esophagitis: Secondary | ICD-10-CM | POA: Diagnosis present

## 2016-04-23 HISTORY — PX: ENDARTERECTOMY: SHX5162

## 2016-04-23 HISTORY — PX: PATCH ANGIOPLASTY: SHX6230

## 2016-04-23 HISTORY — DX: Other specified postprocedural states: Z98.890

## 2016-04-23 LAB — CREATININE, SERUM: Creatinine, Ser: 0.99 mg/dL (ref 0.61–1.24)

## 2016-04-23 LAB — CBC
HCT: 36.1 % — ABNORMAL LOW (ref 39.0–52.0)
Hemoglobin: 12.4 g/dL — ABNORMAL LOW (ref 13.0–17.0)
MCH: 32.4 pg (ref 26.0–34.0)
MCHC: 34.3 g/dL (ref 30.0–36.0)
MCV: 94.3 fL (ref 78.0–100.0)
PLATELETS: 158 10*3/uL (ref 150–400)
RBC: 3.83 MIL/uL — AB (ref 4.22–5.81)
RDW: 13 % (ref 11.5–15.5)
WBC: 9.5 10*3/uL (ref 4.0–10.5)

## 2016-04-23 SURGERY — ENDARTERECTOMY, CAROTID
Anesthesia: General | Site: Neck | Laterality: Right

## 2016-04-23 MED ORDER — POTASSIUM CHLORIDE CRYS ER 20 MEQ PO TBCR: 20.0000 meq | EXTENDED_RELEASE_TABLET | Freq: Every day | ORAL | Status: DC | PRN

## 2016-04-23 MED ORDER — PROPOFOL 10 MG/ML IV BOLUS
INTRAVENOUS | Status: DC | PRN
  Administered 2016-04-23: 50 mg via INTRAVENOUS
  Administered 2016-04-23: 150 mg via INTRAVENOUS

## 2016-04-23 MED ORDER — ACETAMINOPHEN 325 MG RE SUPP: 325.0000 mg | RECTAL | Status: DC | PRN

## 2016-04-23 MED ORDER — ALUM & MAG HYDROXIDE-SIMETH 200-200-20 MG/5ML PO SUSP: 15.0000 mL | ORAL | Status: DC | PRN

## 2016-04-23 MED ORDER — ROCURONIUM BROMIDE 100 MG/10ML IV SOLN
INTRAVENOUS | Status: DC | PRN
  Administered 2016-04-23: 40 mg via INTRAVENOUS

## 2016-04-23 MED ORDER — FENTANYL CITRATE (PF) 100 MCG/2ML IJ SOLN
INTRAMUSCULAR | Status: AC
  Filled 2016-04-23: qty 2

## 2016-04-23 MED ORDER — PHENYLEPHRINE 40 MCG/ML (10ML) SYRINGE FOR IV PUSH (FOR BLOOD PRESSURE SUPPORT)
PREFILLED_SYRINGE | INTRAVENOUS | Status: DC | PRN
  Administered 2016-04-23 (×2): 40 ug via INTRAVENOUS
  Administered 2016-04-23 (×2): 120 ug via INTRAVENOUS

## 2016-04-23 MED ORDER — DEXTROSE 5 % IV SOLN: 1.5000 g | Freq: Two times a day (BID) | INTRAVENOUS | Status: DC

## 2016-04-23 MED ORDER — CHLORHEXIDINE GLUCONATE CLOTH 2 % EX PADS: 6.0000 | MEDICATED_PAD | Freq: Once | CUTANEOUS | Status: DC

## 2016-04-23 MED ORDER — DUTASTERIDE 0.5 MG PO CAPS
0.5000 mg | ORAL_CAPSULE | Freq: Every evening | ORAL | Status: DC
  Administered 2016-04-23: 0.5 mg via ORAL
  Filled 2016-04-23: qty 1

## 2016-04-23 MED ORDER — ATORVASTATIN CALCIUM 20 MG PO TABS
20.0000 mg | ORAL_TABLET | Freq: Every evening | ORAL | Status: DC
  Administered 2016-04-23: 20 mg via ORAL
  Filled 2016-04-23: qty 1

## 2016-04-23 MED ORDER — DEXTROSE 5 % IV SOLN
1.5000 g | Freq: Two times a day (BID) | INTRAVENOUS | Status: AC
  Administered 2016-04-23 – 2016-04-24 (×2): 1.5 g via INTRAVENOUS
  Filled 2016-04-23 (×2): qty 1.5

## 2016-04-23 MED ORDER — VERAPAMIL HCL ER 240 MG PO TBCR
240.0000 mg | EXTENDED_RELEASE_TABLET | Freq: Every morning | ORAL | Status: DC
  Filled 2016-04-23: qty 1

## 2016-04-23 MED ORDER — GUAIFENESIN-DM 100-10 MG/5ML PO SYRP: 15.0000 mL | ORAL_SOLUTION | ORAL | Status: DC | PRN

## 2016-04-23 MED ORDER — PHENYLEPHRINE HCL 10 MG/ML IJ SOLN
INTRAVENOUS | Status: DC | PRN
  Administered 2016-04-23: 20 ug/min via INTRAVENOUS

## 2016-04-23 MED ORDER — ONDANSETRON HCL 4 MG/2ML IJ SOLN
INTRAMUSCULAR | Status: DC | PRN
  Administered 2016-04-23: 4 mg via INTRAVENOUS

## 2016-04-23 MED ORDER — FENTANYL CITRATE (PF) 100 MCG/2ML IJ SOLN
INTRAMUSCULAR | Status: DC | PRN
  Administered 2016-04-23: 100 ug via INTRAVENOUS
  Administered 2016-04-23: 50 ug via INTRAVENOUS
  Administered 2016-04-23: 100 ug via INTRAVENOUS

## 2016-04-23 MED ORDER — DEXTROSE 5 % IV SOLN
1.5000 g | INTRAVENOUS | Status: AC
  Administered 2016-04-23: 1.5 g via INTRAVENOUS

## 2016-04-23 MED ORDER — LIDOCAINE HCL (CARDIAC) 20 MG/ML IV SOLN
INTRAVENOUS | Status: DC | PRN
  Administered 2016-04-23: 90 mg via INTRAVENOUS

## 2016-04-23 MED ORDER — ENOXAPARIN SODIUM 40 MG/0.4ML ~~LOC~~ SOLN: 40.0000 mg | SUBCUTANEOUS | Status: DC

## 2016-04-23 MED ORDER — SUGAMMADEX SODIUM 200 MG/2ML IV SOLN
INTRAVENOUS | Status: AC
  Filled 2016-04-23: qty 2

## 2016-04-23 MED ORDER — LABETALOL HCL 5 MG/ML IV SOLN
INTRAVENOUS | Status: DC | PRN
  Administered 2016-04-23: 5 mg via INTRAVENOUS
  Administered 2016-04-23: 10 mg via INTRAVENOUS
  Administered 2016-04-23: 5 mg via INTRAVENOUS

## 2016-04-23 MED ORDER — MIDAZOLAM HCL 5 MG/5ML IJ SOLN
INTRAMUSCULAR | Status: DC | PRN
  Administered 2016-04-23: 2 mg via INTRAVENOUS

## 2016-04-23 MED ORDER — DEXTROSE 5 % IV SOLN
INTRAVENOUS | Status: AC
  Filled 2016-04-23: qty 1.5

## 2016-04-23 MED ORDER — HEPARIN SODIUM (PORCINE) 1000 UNIT/ML IJ SOLN
INTRAMUSCULAR | Status: AC
  Filled 2016-04-23: qty 2

## 2016-04-23 MED ORDER — LIDOCAINE 2% (20 MG/ML) 5 ML SYRINGE
INTRAMUSCULAR | Status: AC
  Filled 2016-04-23: qty 10

## 2016-04-23 MED ORDER — PROTAMINE SULFATE 10 MG/ML IV SOLN
INTRAVENOUS | Status: AC
Start: 2016-04-23 — End: 2016-04-23
  Filled 2016-04-23: qty 10

## 2016-04-23 MED ORDER — 0.9 % SODIUM CHLORIDE (POUR BTL) OPTIME
TOPICAL | Status: DC | PRN
  Administered 2016-04-23: 3000 mL

## 2016-04-23 MED ORDER — OMEPRAZOLE MAGNESIUM 20 MG PO TBEC: 20.0000 mg | DELAYED_RELEASE_TABLET | Freq: Every morning | ORAL | Status: DC

## 2016-04-23 MED ORDER — SODIUM CHLORIDE 0.9 % IV SOLN: INTRAVENOUS | Status: DC

## 2016-04-23 MED ORDER — PHENOL 1.4 % MT LIQD
1.0000 | OROMUCOSAL | Status: DC | PRN
  Administered 2016-04-23: 1 via OROMUCOSAL
  Filled 2016-04-23: qty 177

## 2016-04-23 MED ORDER — SODIUM CHLORIDE 0.9 % IV SOLN: 500.0000 mL | Freq: Once | INTRAVENOUS | Status: DC | PRN

## 2016-04-23 MED ORDER — DOCUSATE SODIUM 100 MG PO CAPS
100.0000 mg | ORAL_CAPSULE | Freq: Every day | ORAL | Status: DC
  Filled 2016-04-23: qty 1

## 2016-04-23 MED ORDER — ROCURONIUM BROMIDE 10 MG/ML (PF) SYRINGE
PREFILLED_SYRINGE | INTRAVENOUS | Status: AC
  Filled 2016-04-23: qty 10

## 2016-04-23 MED ORDER — LIDOCAINE HCL (PF) 1 % IJ SOLN
INTRAMUSCULAR | Status: AC
  Filled 2016-04-23: qty 30

## 2016-04-23 MED ORDER — LABETALOL HCL 5 MG/ML IV SOLN: 10.0000 mg | INTRAVENOUS | Status: DC | PRN

## 2016-04-23 MED ORDER — MIDAZOLAM HCL 2 MG/2ML IJ SOLN
INTRAMUSCULAR | Status: AC
  Filled 2016-04-23: qty 2

## 2016-04-23 MED ORDER — SUGAMMADEX SODIUM 200 MG/2ML IV SOLN
INTRAVENOUS | Status: DC | PRN
  Administered 2016-04-23: 200 mg via INTRAVENOUS

## 2016-04-23 MED ORDER — SODIUM CHLORIDE 0.9 % IV SOLN
INTRAVENOUS | Status: DC | PRN
  Administered 2016-04-23: 500 mL

## 2016-04-23 MED ORDER — MORPHINE SULFATE (PF) 2 MG/ML IV SOLN: 1.0000 mg | INTRAVENOUS | Status: DC | PRN

## 2016-04-23 MED ORDER — FENTANYL CITRATE (PF) 100 MCG/2ML IJ SOLN
25.0000 ug | INTRAMUSCULAR | Status: DC | PRN
  Administered 2016-04-23 (×3): 50 ug via INTRAVENOUS

## 2016-04-23 MED ORDER — PROPOFOL 10 MG/ML IV BOLUS
INTRAVENOUS | Status: AC
  Filled 2016-04-23: qty 40

## 2016-04-23 MED ORDER — PROTAMINE SULFATE 10 MG/ML IV SOLN
INTRAVENOUS | Status: DC | PRN
  Administered 2016-04-23: 20 mg via INTRAVENOUS

## 2016-04-23 MED ORDER — SUCCINYLCHOLINE CHLORIDE 20 MG/ML IJ SOLN
INTRAMUSCULAR | Status: DC | PRN
  Administered 2016-04-23: 120 mg via INTRAVENOUS

## 2016-04-23 MED ORDER — HEPARIN SODIUM (PORCINE) 1000 UNIT/ML IJ SOLN
INTRAMUSCULAR | Status: DC | PRN
Start: 2016-04-23 — End: 2016-04-23
  Administered 2016-04-23: 2000 [IU] via INTRAVENOUS
  Administered 2016-04-23: 10000 [IU] via INTRAVENOUS

## 2016-04-23 MED ORDER — MAGNESIUM SULFATE 2 GM/50ML IV SOLN: 2.0000 g | Freq: Every day | INTRAVENOUS | Status: DC | PRN

## 2016-04-23 MED ORDER — ASPIRIN EC 81 MG PO TBEC
81.0000 mg | DELAYED_RELEASE_TABLET | Freq: Every day | ORAL | Status: DC
  Administered 2016-04-23: 81 mg via ORAL
  Filled 2016-04-23: qty 1

## 2016-04-23 MED ORDER — ONDANSETRON HCL 4 MG/2ML IJ SOLN: 4.0000 mg | Freq: Four times a day (QID) | INTRAMUSCULAR | Status: DC | PRN

## 2016-04-23 MED ORDER — OXYCODONE-ACETAMINOPHEN 5-325 MG PO TABS
1.0000 | ORAL_TABLET | ORAL | Status: DC | PRN
  Administered 2016-04-23 (×2): 1 via ORAL
  Filled 2016-04-23: qty 2

## 2016-04-23 MED ORDER — HYDRALAZINE HCL 20 MG/ML IJ SOLN: 5.0000 mg | INTRAMUSCULAR | Status: DC | PRN

## 2016-04-23 MED ORDER — FLUTICASONE PROPIONATE 50 MCG/ACT NA SUSP: 2.0000 | Freq: Every day | NASAL | Status: DC | PRN

## 2016-04-23 MED ORDER — PANTOPRAZOLE SODIUM 40 MG PO TBEC
40.0000 mg | DELAYED_RELEASE_TABLET | Freq: Every day | ORAL | Status: DC
  Administered 2016-04-23 – 2016-04-24 (×2): 40 mg via ORAL
  Filled 2016-04-23 (×2): qty 1

## 2016-04-23 MED ORDER — OXYCODONE-ACETAMINOPHEN 5-325 MG PO TABS
ORAL_TABLET | ORAL | Status: AC
  Filled 2016-04-23: qty 1

## 2016-04-23 MED ORDER — IRBESARTAN 300 MG PO TABS
300.0000 mg | ORAL_TABLET | Freq: Every day | ORAL | Status: DC
  Administered 2016-04-23 – 2016-04-24 (×2): 300 mg via ORAL
  Filled 2016-04-23 (×2): qty 1

## 2016-04-23 MED ORDER — LACTATED RINGERS IV SOLN
INTRAVENOUS | Status: DC | PRN
  Administered 2016-04-23 (×2): via INTRAVENOUS

## 2016-04-23 MED ORDER — METOPROLOL TARTRATE 5 MG/5ML IV SOLN: 2.0000 mg | INTRAVENOUS | Status: DC | PRN

## 2016-04-23 MED ORDER — ACETAMINOPHEN 325 MG PO TABS
325.0000 mg | ORAL_TABLET | ORAL | Status: DC | PRN
  Administered 2016-04-24: 650 mg via ORAL
  Filled 2016-04-23: qty 2

## 2016-04-23 SURGICAL SUPPLY — 47 items
CANISTER SUCTION 2500CC (MISCELLANEOUS) ×3 IMPLANT
CATH ROBINSON RED A/P 18FR (CATHETERS) ×3 IMPLANT
CLIP TI MEDIUM 6 (CLIP) ×3 IMPLANT
CLIP TI WIDE RED SMALL 6 (CLIP) ×3 IMPLANT
CRADLE DONUT ADULT HEAD (MISCELLANEOUS) ×3 IMPLANT
DRAIN CHANNEL 15F RND FF W/TCR (WOUND CARE) IMPLANT
ELECT REM PT RETURN 9FT ADLT (ELECTROSURGICAL) ×3
ELECTRODE REM PT RTRN 9FT ADLT (ELECTROSURGICAL) ×1 IMPLANT
EVACUATOR SILICONE 100CC (DRAIN) IMPLANT
GAUZE SPONGE 4X4 12PLY STRL (GAUZE/BANDAGES/DRESSINGS) ×3 IMPLANT
GLOVE BIO SURGEON STRL SZ 6.5 (GLOVE) ×2 IMPLANT
GLOVE BIO SURGEON STRL SZ7.5 (GLOVE) ×6 IMPLANT
GLOVE BIO SURGEONS STRL SZ 6.5 (GLOVE) ×1
GLOVE BIOGEL PI IND STRL 6.5 (GLOVE) ×2 IMPLANT
GLOVE BIOGEL PI INDICATOR 6.5 (GLOVE) ×4
GOWN STRL REUS W/ TWL LRG LVL3 (GOWN DISPOSABLE) ×2 IMPLANT
GOWN STRL REUS W/ TWL XL LVL3 (GOWN DISPOSABLE) ×2 IMPLANT
GOWN STRL REUS W/TWL LRG LVL3 (GOWN DISPOSABLE) ×4
GOWN STRL REUS W/TWL XL LVL3 (GOWN DISPOSABLE) ×4
HEMOSTAT SNOW SURGICEL 2X4 (HEMOSTASIS) IMPLANT
INSERT FOGARTY SM (MISCELLANEOUS) ×3 IMPLANT
IV ADAPTER SYR DOUBLE MALE LL (MISCELLANEOUS) ×3 IMPLANT
KIT BASIN OR (CUSTOM PROCEDURE TRAY) ×3 IMPLANT
KIT ROOM TURNOVER OR (KITS) ×3 IMPLANT
LIQUID BAND (GAUZE/BANDAGES/DRESSINGS) ×3 IMPLANT
NEEDLE HYPO 25GX1X1/2 BEV (NEEDLE) IMPLANT
NS IRRIG 1000ML POUR BTL (IV SOLUTION) ×9 IMPLANT
PACK CAROTID (CUSTOM PROCEDURE TRAY) ×3 IMPLANT
PAD ARMBOARD 7.5X6 YLW CONV (MISCELLANEOUS) ×6 IMPLANT
PATCH VASC XENOSURE 1CMX6CM (Vascular Products) ×2 IMPLANT
PATCH VASC XENOSURE 1X6 (Vascular Products) ×1 IMPLANT
SET COLLECT BLD 21X3/4 12 PB (MISCELLANEOUS) ×3 IMPLANT
SHUNT CAROTID BYPASS 10 (VASCULAR PRODUCTS) ×3 IMPLANT
SHUNT CAROTID BYPASS 12FRX15.5 (VASCULAR PRODUCTS) IMPLANT
STOPCOCK 4 WAY LG BORE MALE ST (IV SETS) ×3 IMPLANT
SUT ETHILON 3 0 PS 1 (SUTURE) IMPLANT
SUT PROLENE 6 0 BV (SUTURE) ×3 IMPLANT
SUT PROLENE 7 0 BV 1 (SUTURE) IMPLANT
SUT SILK 3 0 (SUTURE)
SUT SILK 3-0 18XBRD TIE 12 (SUTURE) IMPLANT
SUT VIC AB 3-0 SH 27 (SUTURE) ×4
SUT VIC AB 3-0 SH 27X BRD (SUTURE) ×2 IMPLANT
SUT VICRYL 4-0 PS2 18IN ABS (SUTURE) ×3 IMPLANT
SYR CONTROL 10ML LL (SYRINGE) IMPLANT
TUBING ART PRESS 48 MALE/FEM (TUBING) ×3 IMPLANT
TUBING EXTENTION W/L.L. (IV SETS) ×3 IMPLANT
WATER STERILE IRR 1000ML POUR (IV SOLUTION) ×3 IMPLANT

## 2016-04-23 NOTE — H&P (Signed)
     History and Physical Update  The patient was interviewed and re-examined.  The patient's previous History and Physical has been reviewed and is unchanged from the office. Discussed risks and benefits of surgery, proceed with right carotid endarterectomy.  Antwian Santaana C. Donzetta Matters, MD [facility] [place] Office: [phone] Pager: [phone]   04/23/2016, 7:18 AM

## 2016-04-23 NOTE — Progress Notes (Signed)
Pharmacy Consult: Antibiotics renal dose adjustment  23 YOM s/p R-CEA, getting zinacef for post-op prophylaxis. Pharmacy is consulted for antibiotics renal dose adjustment. Scr 1.17, est. crcl 70 ml/min. He received pre-op dose at 0745.  Plan: Zinacef 1.5 g IV Q 12 hrs x 2 doses as ordered, no adjustment needed. Pharmacy sign off.   Thanks.  Maryanna Shape, PharmD, BCPS  Clinical Pharmacist  Pager: [phone]

## 2016-04-23 NOTE — Transfer of Care (Signed)
Immediate Anesthesia Transfer of Care Note  Patient: [facility]  Procedure(s) Performed: Procedure(s): RIGHT CAROTID ARTERY ENDARTERECTOMY (Right) WITH 1 cm x 6 cm XENOSURE PATCH ANGIOPLASTY (Right)  Patient Location: PACU  Anesthesia Type:General  Level of Consciousness: awake, alert  and oriented  Airway & Oxygen Therapy: Patient Spontanous Breathing and Patient connected to nasal cannula oxygen  Post-op Assessment: Report given to RN, Post -op Vital signs reviewed and stable, Patient moving all extremities X 4 and Patient able to stick tongue midline  Post vital signs: Reviewed and stable  Last Vitals:  Vitals:   04/23/16 0625 04/23/16 0950  BP: (!) 146/68   Pulse: 86   Resp: 20 (P) 16  Temp: 36.8 C (P) 36.7 C    Last Pain:  Vitals:   04/23/16 0950  TempSrc:   PainSc: (P) 0-No pain      Patients Stated Pain Goal: 3 ([id number])  Complications: No apparent anesthesia complications

## 2016-04-23 NOTE — Anesthesia Preprocedure Evaluation (Signed)
Anesthesia Evaluation  Patient identified by MRN, date of birth, ID band Patient awake    Reviewed: Allergy & Precautions  Airway Mallampati: II  TM Distance: <3 FB     Dental   Pulmonary sleep apnea , pneumonia,    breath sounds clear to auscultation       Cardiovascular hypertension, + Peripheral Vascular Disease   Rhythm:Regular Rate:Normal     Neuro/Psych  Headaches,    GI/Hepatic Neg liver ROS, GERD  ,  Endo/Other    Renal/GU Renal disease     Musculoskeletal  (+) Arthritis ,   Abdominal   Peds  Hematology   Anesthesia Other Findings   Reproductive/Obstetrics                             Anesthesia Physical Anesthesia Plan  ASA: III  Anesthesia Plan: General   Post-op Pain Management:    Induction: Intravenous  Airway Management Planned: Oral ETT  Additional Equipment:   Intra-op Plan:   Post-operative Plan: Possible Post-op intubation/ventilation  Informed Consent:   Dental advisory given  Plan Discussed with: CRNA and Anesthesiologist  Anesthesia Plan Comments:         Anesthesia Quick Evaluation

## 2016-04-23 NOTE — Op Note (Signed)
    Patient name: Kyle English MRN: [id number] DOB: 11-29-1950 Sex: male  04/23/2016 Pre-operative Diagnosis: asymptomatic high grade right carotid stenosis Post-operative diagnosis:  Same Surgeon:  Eda Paschal. Donzetta Matters, MD Assistant: Ruta Hinds, MD Procedure Performed: Right carotid endarterectomy with bovine pericardial patch angioplasty  Indications:  65 year old white male with history of high cholesterol hypertension asymptomatic severe right carotid stenosis.  Findings: there are 2 areas of focal stenoses one in the common carotid one in the proximal internal carotid artery. Following endarterectomy and patch there were expected Doppler waveforms consistent with patency. Back pressure of 38mmHg mean.   Procedure:  The patient was correctly identified and taken to the operating room placed supine on the operating table general anesthesia induced advice given sterilely prepped and draped in the usual fashion. Timeout was called and made an marginal incision on the anterior border sternocleidomastoid divided through the subcutaneous tissue platysma down to the level of the IJ. Facial veins were divided between ties. Common carotid was identified and circled with umbilical tape. Then dissected up to the external carotid artery circled first branch with a tie in the main trunk with vessel loop. Patient this time was heparinized and ACT returned here to 50. Then dissected out the internal carotid artery identified the hypoglossal nerve protected throughout its course. Following this we encircled that with vessel loop. This time the vessel loop was tightened on the internal carotid artery followed by clamping of the common and external arteries. Back pressure was checked which was 60 mmHg mean. We elected not to shunt. Internal carotid artery was clamped and opened with 11 blade extended with Potts scissors endarterectomy performed with Penfield retractor and eversion of the external carotid artery.  It did feather distally and proximally there was sacral lesion. Then sewed a patch in place 6-0 Prolene suture. Prior to completing our anastomosis we did flush and de-air the bulb. After completing the suture line Doppler signals were expected with continuous Doppler diastolic flow in the internal carotid artery. Patient was given protamine which he did tolerate well hemostasis obtained the wound closed in layers the platysma with 3-0 Vicryl to skin for Monocryl. Dermabond was placed level skin is then let awaken from anesthesia having tolerated the procedure well was noted to be neurologically intact. Transfer PACU with plans for continued monitoring.  Channelle Bottger C. Donzetta Matters, MD [facility] [place] Office: [phone] Pager: [phone]

## 2016-04-23 NOTE — Anesthesia Postprocedure Evaluation (Signed)
Anesthesia Post Note  Patient: Kyle English [facility]  Procedure(s) Performed: Procedure(s) (LRB): RIGHT CAROTID ARTERY ENDARTERECTOMY (Right) WITH 1 cm x 6 cm XENOSURE PATCH ANGIOPLASTY (Right)  Patient location during evaluation: PACU Anesthesia Type: General Level of consciousness: awake Pain management: pain level controlled Vital Signs Assessment: post-procedure vital signs reviewed and stable Respiratory status: spontaneous breathing Cardiovascular status: stable Anesthetic complications: no    Last Vitals:  Vitals:   04/23/16 1005 04/23/16 1020  BP: 129/78 134/76  Pulse:  63  Resp: 16 16  Temp:      Last Pain:  Vitals:   04/23/16 1020  TempSrc:   PainSc: 1                  EDWARDS,Shaleena Crusoe

## 2016-04-23 NOTE — Anesthesia Procedure Notes (Signed)
Procedure Name: Intubation Date/Time: 04/23/2016 7:39 AM Performed by: [place] Pink Pre-anesthesia Checklist: Patient identified, Emergency Drugs available, Suction available, Patient being monitored and Timeout performed Patient Re-evaluated:Patient Re-evaluated prior to inductionOxygen Delivery Method: Circle system utilized Preoxygenation: Pre-oxygenation with 100% oxygen Intubation Type: IV induction and Rapid sequence Laryngoscope Size: Glidescope and 4 Grade View: Grade I Tube type: Oral Tube size: 7.5 mm Number of attempts: 1 Airway Equipment and Method: Stylet and Video-laryngoscopy Placement Confirmation: ETT inserted through vocal cords under direct vision,  positive ETCO2 and breath sounds checked- equal and bilateral Secured at: 25 cm Tube secured with: Tape Dental Injury: Teeth and Oropharynx as per pre-operative assessment

## 2016-04-23 NOTE — Progress Notes (Signed)
  [facility] Day of [facility] Note  Subjective:  Patient seen in PACU. Right neck incision with mild soreness.   Vitals:   04/23/16 1005 04/23/16 1020  BP: 129/78 134/76  Pulse:  63  Resp: 16 16  Temp:     Right neck incision no hematoma Equal strength upper and lower extremities bilaterally Smile symmetric, tongue midline  Assessment/Plan:  This is a 65 y.o. male who is s/p right carotid endarterectomy  Stable post-op Neuro intact To 3S when bed available.    Virgina Jock, [place] Pager: [phone] 04/23/2016 12:00 PM

## 2016-04-24 ENCOUNTER — Telehealth: Payer: Self-pay | Admitting: Vascular Surgery

## 2016-04-24 ENCOUNTER — Encounter (HOSPITAL_COMMUNITY): Payer: Self-pay | Admitting: Vascular Surgery

## 2016-04-24 LAB — CBC
HEMATOCRIT: 38.9 % — AB (ref 39.0–52.0)
HEMOGLOBIN: 12.9 g/dL — AB (ref 13.0–17.0)
MCH: 31.5 pg (ref 26.0–34.0)
MCHC: 33.2 g/dL (ref 30.0–36.0)
MCV: 94.9 fL (ref 78.0–100.0)
Platelets: 157 10*3/uL (ref 150–400)
RBC: 4.1 MIL/uL — AB (ref 4.22–5.81)
RDW: 12.9 % (ref 11.5–15.5)
WBC: 7.4 10*3/uL (ref 4.0–10.5)

## 2016-04-24 LAB — BASIC METABOLIC PANEL
Anion gap: 9 (ref 5–15)
BUN: 8 mg/dL (ref 6–20)
CHLORIDE: 105 mmol/L (ref 101–111)
CO2: 25 mmol/L (ref 22–32)
Calcium: 9.5 mg/dL (ref 8.9–10.3)
Creatinine, Ser: 1.04 mg/dL (ref 0.61–1.24)
GFR calc non Af Amer: >60 mL/min (ref >60)
Glucose, Bld: 109 mg/dL — ABNORMAL HIGH (ref 65–99)
Potassium: 3.8 mmol/L (ref 3.5–5.1)
SODIUM: 139 mmol/L (ref 135–145)

## 2016-04-24 LAB — POCT ACTIVATED CLOTTING TIME: ACTIVATED CLOTTING TIME: 246 s

## 2016-04-24 MED ORDER — OXYCODONE-ACETAMINOPHEN 5-325 MG PO TABS: 1.0000 | ORAL_TABLET | Freq: Four times a day (QID) | ORAL | 0 refills | Status: DC | PRN

## 2016-04-24 NOTE — Progress Notes (Addendum)
  [facility] Progress Note  Subjective  - POD #1  Having mild headache right side this am.   Objective Vitals:   04/23/16 2245 04/24/16 0254  BP: (!) 144/69 123/63  Pulse: 91 82  Resp: 12 12  Temp: 98.2 F (36.8 C) 98.1 F (36.7 C)    Intake/Output Summary (Last 24 hours) at 04/24/16 0718 Last data filed at 04/23/16 2100  Gross per 24 hour  Intake             2680 ml  Output               40 ml  Net             2640 ml   Right neck incision clean and intact. No hematoma. 5/5 strength upper and lower extremities. Tongue without deviation. Smile symmetric.   Assessment/Planning: 65 y.o. male is s/p: right carotid endarterectomy 1 Day Post-Op   Neuro exam intact.  Neck without hematoma.  Has ambulated and voided. Tolerated diet without difficulty.  D/c home after breakfast.  Tylenol for headache.  F/u in 2 weeks. On ASA and statin.   Alvia Grove 04/24/2016 7:18 AM --  Laboratory CBC    Component Value Date/Time   WBC 7.4 04/24/2016 0400   HGB 12.9 (L) 04/24/2016 0400   HCT 38.9 (L) 04/24/2016 0400   PLT 157 04/24/2016 0400    BMET    Component Value Date/Time   NA 139 04/24/2016 0400   K 3.8 04/24/2016 0400   CL 105 04/24/2016 0400   CO2 25 04/24/2016 0400   GLUCOSE 109 (H) 04/24/2016 0400   BUN 8 04/24/2016 0400   CREATININE 1.04 04/24/2016 0400   CALCIUM 9.5 04/24/2016 0400   GFRNONAA >60 04/24/2016 0400   GFRAA >60 04/24/2016 0400    COAG Lab Results  Component Value Date   INR 0.99 04/18/2016   No results found for: PTT  Antibiotics Anti-infectives    Start     Dose/Rate Route Frequency Ordered Stop   04/23/16 1800  cefUROXime (ZINACEF) 1.5 g in dextrose 5 % 50 mL IVPB     1.5 g 100 mL/hr over 30 Minutes Intravenous Every 12 hours 04/23/16 1607 04/24/16 0652   04/23/16 1615  cefUROXime (ZINACEF) 1.5 g in dextrose 5 % 50 mL IVPB  Status:  Discontinued     1.5 g 100 mL/hr over 30 Minutes Intravenous Every 12  hours 04/23/16 1603 04/23/16 1607   04/23/16 0618  dextrose 5 % with cefUROXime (ZINACEF) ADS Med    Comments:  Leandrew Koyanagi   : cabinet override      04/23/16 0618 04/23/16 0744   04/23/16 0615  cefUROXime (ZINACEF) 1.5 g in dextrose 5 % 50 mL IVPB     1.5 g 100 mL/hr over 30 Minutes Intravenous 30 min pre-op 04/23/16 0615 04/23/16 0744       Virgina Jock, PA-C [facility] Office: [phone] Pager: [phone] 04/24/2016 7:18 AM  I have independently interviewed patient and agree with PA assessment and plan above.   Dove Gresham C. Donzetta Matters, MD [facility] [place] Office: [phone] Pager: [phone]

## 2016-04-24 NOTE — Progress Notes (Signed)
Discharge instructions reviewed with patient and spouse.  Prescription for percocet given.

## 2016-04-24 NOTE — Telephone Encounter (Signed)
I scheduled an appt for 05/09/16 at 8:45am w/ [facility]. I left a voicemail for the patient regarding this appt and also mailed an appt letter. awt

## 2016-04-24 NOTE — Discharge Summary (Signed)
[facility] Discharge Summary  ERIK DEBENEDETTI [birth year] 65 y.o. male  [id number]  Admission Date: 04/23/2016  Discharge Date: 04/24/2016  Physician: Servando Snare, MD  Admission Diagnosis: Right carotid stenosis  HPI:   This is a 65 y.o. male who presented for evaluation of right carotid artery stenosis. He has history of ACDF. Neck incision otherwise has been mostly healthy. He denies any history of stroke TIA or amaurosis. He denies any history of pain. He has no limitations to his walking. He does take aspirin he does take a statin as directed by Dr. Inda Merlin. Recently had carotid duplex at her office which demonstrated high-grade stenosis of the right carotid with left internal carotid less than 50%. He is therefore referred to our office for further evaluation.  Hospital Course:  The patient was admitted to the hospital and taken to the operating room on 04/23/2016 and underwent right carotid endarterectomy.  The patient tolerated the procedure well and was transported to the PACU in stable condition.  By POD 1, the patient's neuro status was intact. He had a mild right sided headache. His BP was stable. His right neck incision was without hematoma. He was ambulating, voiding and tolerating a diet without difficulty. He was discharged home on POD 1 in good condition.     Recent Labs  04/24/16 0400  NA 139  K 3.8  CL 105  CO2 25  GLUCOSE 109*  BUN 8  CALCIUM 9.5    Recent Labs  04/23/16 1623 04/24/16 0400  WBC 9.5 7.4  HGB 12.4* 12.9*  HCT 36.1* 38.9*  PLT 158 157   No results for input(s): INR in the last 72 hours.  Discharge Instructions:   The patient is discharged to home with extensive instructions on wound care and progressive ambulation.  They are instructed not to drive or perform any heavy lifting until returning to see the physician in his office.  Discharge Instructions    CAROTID Sugery: Call MD for difficulty swallowing or  speaking; weakness in arms or legs that is a new symtom; severe headache.  If you have increased swelling in the neck and/or  are having difficulty breathing, CALL 911    Complete by:  As directed    Call MD for:  redness, tenderness, or signs of infection (pain, swelling, bleeding, redness, odor or green/yellow discharge around incision site)    Complete by:  As directed    Call MD for:  severe or increased pain, loss or decreased feeling  in affected limb(s)    Complete by:  As directed    Call MD for:  temperature >100.5    Complete by:  As directed    Discharge wound care:    Complete by:  As directed    Shower daily and wash right neck incision gently with soap and water. Do not scrub incision. Do not peel off the skin glue, it will come off on its own.   Driving Restrictions    Complete by:  As directed    No driving for 1 week   Lifting restrictions    Complete by:  As directed    No lifting for 1 week   Resume previous diet    Complete by:  As directed       Discharge Diagnosis:  Right carotid stenosis  Secondary Diagnosis: Patient Active Problem List   Diagnosis Date Noted  . Asymptomatic carotid artery stenosis 04/23/2016   Past Medical History:  Diagnosis Date  .  Arthritis   . Cluster headaches    hx of cluster headaches, takes Verapamil  . Esophageal reflux   . History of CEA (carotid endarterectomy) 04/23/2016  . Hyperlipidemia   . Hypertension   . Kidney stones   . Low iron    10 years ago  . Pneumonia   . Sleep apnea    uses cpap  . Stenosis of right carotid artery   . Vitamin D deficiency       Medication List    TAKE these medications   aspirin 81 MG tablet take 1 tablet by mouth once daily at bedtime   AVODART 0.5 MG capsule Generic drug:  dutasteride take 1 cap by mouth every evening   DIOVAN 320 MG tablet Generic drug:  valsartan take 1 tablet by mouth every morning   EPIPEN 2-PAK 0.3 mg/0.3 mL Soaj injection Generic drug:   EPINEPHrine use once as needed for allergic reaction as directed by physician   FISH OIL + D3 1200-1000 MG-UNIT Caps take 1 cap by mouth daily   FLONASE 50 MCG/ACT nasal spray Generic drug:  fluticasone use 2 sprays in each nostril daily as needed for allergies   GLUCOSAMINE CHONDROITIN JOINT Tabs take 1 tab by mouth twice daily   ibuprofen 200 MG tablet Commonly known as:  ADVIL,MOTRIN Take 200 mg by mouth every 6 (six) hours as needed for moderate pain.   LIPITOR 20 MG tablet Generic drug:  atorvastatin take 1 tablet by mouth in the evening   MULTI ADULT GUMMIES Chew take 2 chewable tablets by mouth daily   oxyCODONE-acetaminophen 5-325 MG tablet Commonly known as:  PERCOCET/ROXICET Take 1-2 tablets by mouth every 6 (six) hours as needed for moderate pain.   PRILOSEC OTC 20 MG tablet Generic drug:  omeprazole take 1 tablet by mouth every morning   sildenafil 20 MG tablet Commonly known as:  REVATIO take 2-5 tablets by mouth once as needed for erectile dysfunction   verapamil 240 MG CR tablet Commonly known as:  CALAN-SR take 1 tablet by mouth every morning   Vitamin D (Ergocalciferol) 50000 units Caps capsule Commonly known as:  DRISDOL take 1 cap by mouth every other week       Percocet #8 No Refill  Disposition: Home  Patient's condition: is Good  Follow up: 1. Dr.  Donzetta Matters in 2 weeks.   Virgina Jock, PA-C [facility] [phone]  --- For [facility] use --- Instructions: Press F2 to tab through selections.  Delete question if not applicable.   Modified Rankin score at D/C (0-6): 0  IV medication needed for:  1. Hypertension: No 2. Hypotension: No  Post-op Complications: No  1. Post-op CVA or TIA: No  2. CN injury: No  3. Myocardial infarction: No  4.  CHF: No  5.  Dysrhythmia (new): No  6. Wound infection: No  7. Reperfusion symptoms: No  8. Return to OR: No  Discharge medications: Statin use:  Yes If No:  [ ]  For Medical reasons, [ ]  Non-compliant, [ ]  Not-indicated ASA use:  Yes  If No: [ ]  For Medical reasons, [ ]  Non-compliant, [ ]  Not-indicated Beta blocker use:  No If No: [ ]  For Medical reasons, [ ]  Non-compliant, [x ] Not-indicated ACE-Inhibitor use:  No If No: [ ]  For Medical reasons, [ ]  Non-compliant, [x ] Not-indicated P2Y12 Antagonist use: No, [ ]  Plavix, [ ]  Plasugrel, [ ]  Ticlopinine, [ ]  Ticagrelor, [ ]  Other, [ ]  No  for medical reason, [ ]  Non-compliant, [x ] Not-indicated Anti-coagulant use:  No, [ ]  Warfarin, [ ]  Rivaroxaban, [ ]  Dabigatran, [ ]  Other, [ ]  No for medical reason, [ ]  Non-compliant, [x]  Not-indicated

## 2016-04-24 NOTE — Care Management Note (Signed)
Case Management Note  Patient Details  Name: HADES OVITT MRN: [id number] Date of Birth: April 14, 1951  Subjective/Objective:   S/P R CEA, from home, for dc today. No needs.                  Action/Plan:   Expected Discharge Date:                  Expected Discharge Plan:  Home/Self Care  In-House Referral:     Discharge planning Services  CM Consult  Post Acute Care Choice:    Choice offered to:     DME Arranged:    DME Agency:     HH Arranged:    HH Agency:     Status of Service:  Completed, signed off  If discussed at [employer] of Stay Meetings, dates discussed:    Additional Comments:  Zenon Mayo, RN 04/24/2016, 9:25 AM

## 2016-04-24 NOTE — Telephone Encounter (Signed)
-----   Message from Mena Goes, RN sent at 04/24/2016  9:48 AM EDT -----   ----- Message ----- From: Alvia Grove, PA-C Sent: 04/24/2016   7:20 AM To: [facility] Charge Pool  S/p right CEA 04/23/16  F/u with [facility] in 2 weeks  Thanks Maudie Mercury

## 2016-04-27 DIAGNOSIS — Z23 Encounter for immunization: Secondary | ICD-10-CM | POA: Diagnosis not present

## 2016-05-04 ENCOUNTER — Encounter: Payer: Self-pay | Admitting: Vascular Surgery

## 2016-05-09 ENCOUNTER — Encounter: Payer: Self-pay | Admitting: Vascular Surgery

## 2016-05-09 ENCOUNTER — Ambulatory Visit (INDEPENDENT_AMBULATORY_CARE_PROVIDER_SITE_OTHER): Payer: Self-pay | Admitting: Vascular Surgery

## 2016-05-09 VITALS — BP 112/73 | HR 92 | Temp 98.4°F | Resp 20 | Ht 70.0 in | Wt 215.0 lb

## 2016-05-09 DIAGNOSIS — I6521 Occlusion and stenosis of right carotid artery: Secondary | ICD-10-CM

## 2016-05-09 NOTE — Addendum Note (Signed)
Addended by: Kaleen Mask on: 05/09/2016 11:11 AM   Modules accepted: Orders

## 2016-05-09 NOTE — Progress Notes (Signed)
Subjective:     Patient ID: Kyle English, male   DOB: 04-15-1951, 65 y.o.   MRN: [id number]  HPI [name] follows up from his postop visit from recent right carotid endarterectomy for asymptomatic disease. He did very well in the hospital and has done very well since. He does complain of minimal discomfort with motion of his neck is otherwise back to his normal level of activity. He takes aspirin nightly.   Review of Systems Discomfort of R neck only Improved bilateral lower extremity edema    Objective:   Physical Exam  Constitutional: He is oriented to person, place, and time. He appears well-developed.  Cardiovascular: Normal rate.   Pulses:      Radial pulses are 2+ on the right side, and 2+ on the left side.  Pulmonary/Chest: Effort normal.  Musculoskeletal: Normal range of motion.  Neurological: He is alert and oriented to person, place, and time. No cranial nerve deficit.  Skin: Skin is warm and dry.       Assessment:     65 year old white male status post right carotid endarterectomy for asymptomatic disease progressing well on the postoperative period.    Plan:     He will continue his aspirin. He'll follow-up in 6 months with carotid duplex. There issues between now and then we will certainly see him sooner.  Jacelyn Cuen C. Donzetta Matters, MD [facility] [place] Office: [phone] Pager: [phone]

## 2016-05-22 DIAGNOSIS — G4733 Obstructive sleep apnea (adult) (pediatric): Secondary | ICD-10-CM | POA: Diagnosis not present

## 2016-07-17 DIAGNOSIS — D124 Benign neoplasm of descending colon: Secondary | ICD-10-CM | POA: Diagnosis not present

## 2016-07-17 DIAGNOSIS — K644 Residual hemorrhoidal skin tags: Secondary | ICD-10-CM | POA: Diagnosis not present

## 2016-07-17 DIAGNOSIS — D126 Benign neoplasm of colon, unspecified: Secondary | ICD-10-CM | POA: Diagnosis not present

## 2016-07-17 DIAGNOSIS — D123 Benign neoplasm of transverse colon: Secondary | ICD-10-CM | POA: Diagnosis not present

## 2016-07-17 DIAGNOSIS — K648 Other hemorrhoids: Secondary | ICD-10-CM | POA: Diagnosis not present

## 2016-07-17 DIAGNOSIS — D122 Benign neoplasm of ascending colon: Secondary | ICD-10-CM | POA: Diagnosis not present

## 2016-07-17 DIAGNOSIS — Z86010 Personal history of colonic polyps: Secondary | ICD-10-CM | POA: Diagnosis not present

## 2016-07-17 DIAGNOSIS — K5730 Diverticulosis of large intestine without perforation or abscess without bleeding: Secondary | ICD-10-CM | POA: Diagnosis not present

## 2016-07-19 DIAGNOSIS — D126 Benign neoplasm of colon, unspecified: Secondary | ICD-10-CM | POA: Diagnosis not present

## 2016-09-04 DIAGNOSIS — H52223 Regular astigmatism, bilateral: Secondary | ICD-10-CM | POA: Diagnosis not present

## 2016-09-04 DIAGNOSIS — H5203 Hypermetropia, bilateral: Secondary | ICD-10-CM | POA: Diagnosis not present

## 2016-10-24 DIAGNOSIS — E782 Mixed hyperlipidemia: Secondary | ICD-10-CM | POA: Diagnosis not present

## 2016-10-24 DIAGNOSIS — I10 Essential (primary) hypertension: Secondary | ICD-10-CM | POA: Diagnosis not present

## 2016-10-24 DIAGNOSIS — E559 Vitamin D deficiency, unspecified: Secondary | ICD-10-CM | POA: Diagnosis not present

## 2016-11-09 ENCOUNTER — Ambulatory Visit: Payer: PPO | Admitting: Vascular Surgery

## 2016-11-09 ENCOUNTER — Ambulatory Visit (HOSPITAL_COMMUNITY): Payer: PPO

## 2016-12-10 ENCOUNTER — Encounter: Payer: Self-pay | Admitting: Vascular Surgery

## 2016-12-14 ENCOUNTER — Ambulatory Visit (INDEPENDENT_AMBULATORY_CARE_PROVIDER_SITE_OTHER): Payer: PPO | Admitting: Vascular Surgery

## 2016-12-14 ENCOUNTER — Ambulatory Visit (HOSPITAL_COMMUNITY)
Admission: RE | Admit: 2016-12-14 | Discharge: 2016-12-14 | Disposition: A | Discharge status: Home / Self Care | Payer: PPO | Source: Ambulatory Visit | Attending: Vascular Surgery | Admitting: Vascular Surgery

## 2016-12-14 ENCOUNTER — Encounter: Payer: Self-pay | Admitting: Vascular Surgery

## 2016-12-14 VITALS — BP 131/79 | HR 85 | Temp 97.4°F | Resp 18 | Ht 70.0 in | Wt 215.0 lb

## 2016-12-14 DIAGNOSIS — I6521 Occlusion and stenosis of right carotid artery: Secondary | ICD-10-CM | POA: Diagnosis not present

## 2016-12-14 DIAGNOSIS — I6523 Occlusion and stenosis of bilateral carotid arteries: Secondary | ICD-10-CM | POA: Diagnosis not present

## 2016-12-14 NOTE — Progress Notes (Signed)
Patient ID: Kyle English, male   DOB: [birth year], 66 y.o.   MRN: [id number]  Reason for Consult: Re-evaluation (6 mo f/u Carotid)   Referred by Darcus Austin, MD  Subjective:     HPI:  Kyle English is a 66 y.o. male underwent previous carotid endartectomy for asymptomatic disease. He has done very well without complaints. Specifically he has not has not had stroke, tia or amaurosis. He has no issues with swallowing or tongue movement. He has healed well and is at his normal activity level.   Past Medical History:  Diagnosis Date  . Arthritis   . Cluster headaches    hx of cluster headaches, takes Verapamil  . Esophageal reflux   . History of CEA (carotid endarterectomy) 04/23/2016  . Hyperlipidemia   . Hypertension   . Kidney stones   . Low iron    10 years ago  . Pneumonia   . Sleep apnea    uses cpap  . Stenosis of right carotid artery   . Vitamin D deficiency    Family History  Problem Relation Age of Onset  . Diabetes Mother   . Hypertension Mother   . Cancer Father        prostate  . Hypertension Sister   . CVA Maternal Grandmother 64   Past Surgical History:  Procedure Laterality Date  . ANTERIOR CERVICAL DISCECTOMY  10/2003  . APPENDECTOMY    . COLONOSCOPY  2010, 2014  . ENDARTERECTOMY Right 04/23/2016   Procedure: RIGHT CAROTID ARTERY ENDARTERECTOMY;  Surgeon: Waynetta Sandy, MD;  Location: [facility];  Service: Vascular;  Laterality: Right;  . INGUINAL HERNIA REPAIR Right 10/05/2008  . PATCH ANGIOPLASTY Right 04/23/2016   Procedure: WITH 1 cm x 6 cm [place];  Surgeon: Waynetta Sandy, MD;  Location: [facility];  Service: Vascular;  Laterality: Right;  . UMBILICAL HERNIA REPAIR  10/2007    Short Social History:  Social History  Substance Use Topics  . Smoking status: Never Smoker  . Smokeless tobacco: Never Used  . Alcohol use No    No Known Allergies  Current Outpatient Prescriptions  Medication Sig Dispense  Refill  . aspirin 81 MG tablet take 1 tablet by mouth once daily at bedtime    . atorvastatin (LIPITOR) 20 MG tablet take 1 tablet by mouth in the evening    . dutasteride (AVODART) 0.5 MG capsule take 1 cap by mouth every evening    . EPINEPHrine (EPIPEN 2-PAK) 0.3 mg/0.3 mL IJ SOAJ injection use once as needed for allergic reaction as directed by physician    . Fish Oil-Cholecalciferol (FISH OIL + D3) 1200-1000 MG-UNIT CAPS take 1 cap by mouth daily    . fluticasone (FLONASE) 50 MCG/ACT nasal spray use 2 sprays in each nostril daily as needed for allergies    . Glucos-Chondroit-Hyaluron-MSM (GLUCOSAMINE CHONDROITIN JOINT) TABS take 1 tab by mouth twice daily    . ibuprofen (ADVIL,MOTRIN) 200 MG tablet Take 200 mg by mouth every 6 (six) hours as needed for moderate pain.    . Multiple Vitamins-Minerals (MULTI ADULT GUMMIES) CHEW take 2 chewable tablets by mouth daily    . omeprazole (PRILOSEC OTC) 20 MG tablet take 1 tablet by mouth every morning    . sildenafil (REVATIO) 20 MG tablet take 2-5 tablets by mouth once as needed for erectile dysfunction    . valsartan (DIOVAN) 320 MG tablet take 1 tablet by mouth every morning    . verapamil (  CALAN-SR) 240 MG CR tablet take 1 tablet by mouth every morning    . Vitamin D, Ergocalciferol, (DRISDOL) 50000 units CAPS capsule take 1 cap by mouth every other week    . oxyCODONE-acetaminophen (PERCOCET/ROXICET) 5-325 MG tablet Take 1-2 tablets by mouth every 6 (six) hours as needed for moderate pain. (Patient not taking: Reported on 05/09/2016) 8 tablet 0   No current facility-administered medications for this visit.     Review of Systems  Constitutional:  Constitutional negative. Eyes: Eyes negative.  Respiratory: Respiratory negative.  Cardiovascular: Cardiovascular negative.  GI: Gastrointestinal negative.  Musculoskeletal: Musculoskeletal negative.  Skin: Skin negative.  Neurological: Neurological negative. Hematologic: Hematologic/lymphatic  negative.  Psychiatric: Psychiatric negative.        Objective:  Objective   Vitals:   12/14/16 1139  BP: 131/79  Pulse: 85  Resp: 18  Temp: 97.4 F (36.3 C)  TempSrc: Oral  SpO2: 97%  Weight: 215 lb (97.5 kg)  Height: 5\' 10"  (1.778 m)   Body mass index is 30.85 kg/m.  Physical Exam  Constitutional: He is oriented to person, place, and time. He appears well-developed.  HENT:  Head: Normocephalic.  Eyes: Pupils are equal, round, and reactive to light.  Neck: Normal range of motion.  Cardiovascular: Normal rate.   Pulses:      Carotid pulses are 2+ on the right side, and 2+ on the left side.      Radial pulses are 2+ on the right side, and 2+ on the left side.       Dorsalis pedis pulses are 2+ on the right side, and 2+ on the left side.  Pulmonary/Chest: Effort normal.  Abdominal: Soft. He exhibits no mass.  Musculoskeletal: Normal range of motion. He exhibits no edema.  Neurological: He is alert and oriented to person, place, and time.  Skin: Skin is warm and dry.  Psychiatric: He has a normal mood and affect. His behavior is normal. Judgment and thought content normal.    Data: I have independently interpreted his carotid duplex today demonstrating a mildly elevated psv on the right that is homogeneous appearing.      Assessment/Plan:    66 year old male previously underwent right carotid endarterectomy for asymptomatic disease. He now has his 6 month follow-up and is doing well with some suggestion of hyperplasia at the patch. I discussed this with him today and we will continue to watch following him in 6 more months. He'll remain on aspirin. We discussed the signs and symptoms of stroke TIA and amaurosis and he will present to near physician if he has any of those. We will otherwise see him in 6 months time.       Waynetta Sandy MD [facility]

## 2016-12-19 NOTE — Addendum Note (Signed)
Addended by: Lianne Cure A on: 12/19/2016 01:17 PM   Modules accepted: Orders

## 2017-05-01 DIAGNOSIS — J309 Allergic rhinitis, unspecified: Secondary | ICD-10-CM | POA: Diagnosis not present

## 2017-05-01 DIAGNOSIS — Z1159 Encounter for screening for other viral diseases: Secondary | ICD-10-CM | POA: Diagnosis not present

## 2017-05-01 DIAGNOSIS — I10 Essential (primary) hypertension: Secondary | ICD-10-CM | POA: Diagnosis not present

## 2017-05-01 DIAGNOSIS — E782 Mixed hyperlipidemia: Secondary | ICD-10-CM | POA: Diagnosis not present

## 2017-05-01 DIAGNOSIS — E559 Vitamin D deficiency, unspecified: Secondary | ICD-10-CM | POA: Diagnosis not present

## 2017-05-01 DIAGNOSIS — N400 Enlarged prostate without lower urinary tract symptoms: Secondary | ICD-10-CM | POA: Diagnosis not present

## 2017-05-01 DIAGNOSIS — Z23 Encounter for immunization: Secondary | ICD-10-CM | POA: Diagnosis not present

## 2017-05-01 DIAGNOSIS — Z0000 Encounter for general adult medical examination without abnormal findings: Secondary | ICD-10-CM | POA: Diagnosis not present

## 2017-06-28 ENCOUNTER — Encounter (HOSPITAL_COMMUNITY): Payer: PPO

## 2017-06-28 ENCOUNTER — Ambulatory Visit: Payer: PPO | Admitting: Vascular Surgery

## 2017-07-05 ENCOUNTER — Encounter: Payer: Self-pay | Admitting: Vascular Surgery

## 2017-07-05 ENCOUNTER — Ambulatory Visit: Payer: PPO | Admitting: Vascular Surgery

## 2017-07-05 ENCOUNTER — Ambulatory Visit (HOSPITAL_COMMUNITY)
Admission: RE | Admit: 2017-07-05 | Discharge: 2017-07-05 | Disposition: A | Discharge status: Home / Self Care | Payer: PPO | Source: Ambulatory Visit | Attending: Vascular Surgery | Admitting: Vascular Surgery

## 2017-07-05 VITALS — BP 136/82 | HR 80 | Temp 99.1°F | Resp 16 | Ht 70.0 in | Wt 209.0 lb

## 2017-07-05 DIAGNOSIS — I6521 Occlusion and stenosis of right carotid artery: Secondary | ICD-10-CM | POA: Diagnosis not present

## 2017-07-05 LAB — VAS US CAROTID
LCCADSYS: -96 cm/s
LEFT ECA DIAS: -25 cm/s
LICAPDIAS: -28 cm/s
Left CCA dist dias: -29 cm/s
Left CCA prox dias: 28 cm/s
Left CCA prox sys: 100 cm/s
Left ICA dist dias: -28 cm/s
Left ICA dist sys: -75 cm/s
Left ICA prox sys: -84 cm/s
RCCADSYS: -93 cm/s
RCCAPDIAS: 30 cm/s
RIGHT CCA MID DIAS: -26 cm/s
RIGHT ECA DIAS: -33 cm/s
Right CCA prox sys: 100 cm/s

## 2017-07-05 NOTE — Progress Notes (Signed)
Patient ID: Kyle English, male   DOB: 05-17-1951, 66 y.o.   MRN: [id number]  Reason for Consult: Carotid (6 mth f/u with bilat Carotid. )   Referred by Darcus Austin, MD  Subjective:     HPI:  Kyle English is a 66 y.o. male with history of right-sided carotid endarterectomy for asymptomatic high-grade stenosis.  He has done very well is never had symptoms.  At last visit there was some evidence of homogeneous plaque at the patch.  He is doing very well.  He has no limitation to his walking.  He continues to take aspirin daily.  He has no complaints related to today's visit.  Past Medical History:  Diagnosis Date  . Arthritis   . Cluster headaches    hx of cluster headaches, takes Verapamil  . Esophageal reflux   . History of CEA (carotid endarterectomy) 04/23/2016  . Hyperlipidemia   . Hypertension   . Kidney stones   . Low iron    10 years ago  . Pneumonia   . Sleep apnea    uses cpap  . Stenosis of right carotid artery   . Vitamin D deficiency    Family History  Problem Relation Age of Onset  . Diabetes Mother   . Hypertension Mother   . Cancer Father        prostate  . Hypertension Sister   . CVA Maternal Grandmother 64   Past Surgical History:  Procedure Laterality Date  . ANTERIOR CERVICAL DISCECTOMY  10/2003  . APPENDECTOMY    . COLONOSCOPY  2010, 2014  . ENDARTERECTOMY Right 04/23/2016   Procedure: RIGHT CAROTID ARTERY ENDARTERECTOMY;  Surgeon: Waynetta Sandy, MD;  Location: [facility];  Service: Vascular;  Laterality: Right;  . INGUINAL HERNIA REPAIR Right 10/05/2008  . PATCH ANGIOPLASTY Right 04/23/2016   Procedure: WITH 1 cm x 6 cm [place];  Surgeon: Waynetta Sandy, MD;  Location: [facility];  Service: Vascular;  Laterality: Right;  . UMBILICAL HERNIA REPAIR  10/2007    Short Social History:  Social History   Tobacco Use  . Smoking status: Never Smoker  . Smokeless tobacco: Never Used  Substance Use Topics  .  Alcohol use: No    No Known Allergies  Current Outpatient Medications  Medication Sig Dispense Refill  . aspirin 81 MG tablet take 1 tablet by mouth once daily at bedtime    . atorvastatin (LIPITOR) 20 MG tablet take 1 tablet by mouth in the evening    . dutasteride (AVODART) 0.5 MG capsule take 1 cap by mouth every evening    . EPINEPHrine (EPIPEN 2-PAK) 0.3 mg/0.3 mL IJ SOAJ injection use once as needed for allergic reaction as directed by physician    . Fish Oil-Cholecalciferol (FISH OIL + D3) 1200-1000 MG-UNIT CAPS take 1 cap by mouth daily    . fluticasone (FLONASE) 50 MCG/ACT nasal spray use 2 sprays in each nostril daily as needed for allergies    . Glucos-Chondroit-Hyaluron-MSM (GLUCOSAMINE CHONDROITIN JOINT) TABS take 1 tab by mouth twice daily    . ibuprofen (ADVIL,MOTRIN) 200 MG tablet Take 200 mg by mouth every 6 (six) hours as needed for moderate pain.    . Multiple Vitamins-Minerals (MULTI ADULT GUMMIES) CHEW take 2 chewable tablets by mouth daily    . omeprazole (PRILOSEC OTC) 20 MG tablet take 1 tablet by mouth every morning    . sildenafil (REVATIO) 20 MG tablet take 2-5 tablets by mouth once as  needed for erectile dysfunction    . verapamil (CALAN-SR) 240 MG CR tablet take 1 tablet by mouth every morning    . Vitamin D, Ergocalciferol, (DRISDOL) 50000 units CAPS capsule take 1 cap by mouth every other week     No current facility-administered medications for this visit.     Review of Systems  Constitutional:  Constitutional negative. HENT: HENT negative.  Eyes: Eyes negative.  Respiratory: Respiratory negative.  Cardiovascular: Cardiovascular negative.  GI: Gastrointestinal negative.  Musculoskeletal: Musculoskeletal negative.  Skin: Skin negative.  Neurological: Neurological negative. Hematologic: Hematologic/lymphatic negative.  Psychiatric: Psychiatric negative.        Objective:  Objective   Vitals:   07/05/17 1603 07/05/17 1606  BP: 134/88 136/82    Pulse: 80   Resp: 16   Temp: 99.1 F (37.3 C)   TempSrc: Oral   SpO2: 98%   Weight: 209 lb (94.8 kg)   Height: 5\' 10"  (1.778 m)    Body mass index is 29.99 kg/m.  Physical Exam  Constitutional: He is oriented to person, place, and time. He appears well-developed.  HENT:  Head: Normocephalic.  Eyes: Pupils are equal, round, and reactive to light.  Neck: Normal range of motion. Neck supple.  Well healed right CEA scar  Cardiovascular: Normal rate.  Pulses:      Carotid pulses are 2+ on the right side, and 2+ on the left side.      Radial pulses are 2+ on the right side, and 2+ on the left side.  Pulmonary/Chest: Effort normal.  Abdominal: Soft. He exhibits no mass.  Musculoskeletal: Normal range of motion. He exhibits no edema.  Neurological: He is alert and oriented to person, place, and time. No cranial nerve deficit.  Skin: Skin is warm and dry.  Psychiatric: He has a normal mood and affect. His behavior is normal. Judgment normal.    Data: I have independently interpreted his carotid duplex exam which demonstrates patent right carotid endarterectomy with mild hyperplasia of the left ICA with 1-39% stenosis.     Assessment/Plan:     66 year old male status post right-sided carotid endarterectomy for asymptomatic disease.  He continues on aspirin.  This point he can follow-up in 1 year.  If he has signs or symptoms of stroke or TIA we will see him sooner.  Carotid duplex in 1 year.     Waynetta Sandy MD [facility]

## 2017-07-22 NOTE — Addendum Note (Signed)
Addended by: Lianne Cure A on: 07/22/2017 11:32 AM   Modules accepted: Orders

## 2017-11-06 DIAGNOSIS — E782 Mixed hyperlipidemia: Secondary | ICD-10-CM | POA: Diagnosis not present

## 2017-11-06 DIAGNOSIS — I10 Essential (primary) hypertension: Secondary | ICD-10-CM | POA: Diagnosis not present

## 2017-11-06 DIAGNOSIS — G44009 Cluster headache syndrome, unspecified, not intractable: Secondary | ICD-10-CM | POA: Diagnosis not present

## 2017-11-06 DIAGNOSIS — E559 Vitamin D deficiency, unspecified: Secondary | ICD-10-CM | POA: Diagnosis not present

## 2018-05-21 DIAGNOSIS — I10 Essential (primary) hypertension: Secondary | ICD-10-CM | POA: Diagnosis not present

## 2018-05-21 DIAGNOSIS — Z6830 Body mass index (BMI) 30.0-30.9, adult: Secondary | ICD-10-CM | POA: Diagnosis not present

## 2018-05-21 DIAGNOSIS — N400 Enlarged prostate without lower urinary tract symptoms: Secondary | ICD-10-CM | POA: Diagnosis not present

## 2018-05-21 DIAGNOSIS — Z0000 Encounter for general adult medical examination without abnormal findings: Secondary | ICD-10-CM | POA: Diagnosis not present

## 2018-05-21 DIAGNOSIS — Z125 Encounter for screening for malignant neoplasm of prostate: Secondary | ICD-10-CM | POA: Diagnosis not present

## 2018-05-21 DIAGNOSIS — Z23 Encounter for immunization: Secondary | ICD-10-CM | POA: Diagnosis not present

## 2018-05-21 DIAGNOSIS — E782 Mixed hyperlipidemia: Secondary | ICD-10-CM | POA: Diagnosis not present

## 2018-05-21 DIAGNOSIS — E559 Vitamin D deficiency, unspecified: Secondary | ICD-10-CM | POA: Diagnosis not present

## 2018-05-21 DIAGNOSIS — I6521 Occlusion and stenosis of right carotid artery: Secondary | ICD-10-CM | POA: Diagnosis not present

## 2018-07-08 ENCOUNTER — Ambulatory Visit (INDEPENDENT_AMBULATORY_CARE_PROVIDER_SITE_OTHER): Payer: PPO | Admitting: Family

## 2018-07-08 ENCOUNTER — Ambulatory Visit (HOSPITAL_COMMUNITY)
Admission: RE | Admit: 2018-07-08 | Discharge: 2018-07-08 | Disposition: A | Discharge status: Home / Self Care | Payer: PPO | Source: Ambulatory Visit | Attending: Family Medicine | Admitting: Family Medicine

## 2018-07-08 ENCOUNTER — Other Ambulatory Visit: Payer: Self-pay

## 2018-07-08 ENCOUNTER — Encounter: Payer: Self-pay | Admitting: Family

## 2018-07-08 VITALS — BP 127/79 | HR 73 | Temp 97.5°F | Resp 20 | Ht 70.0 in | Wt 214.0 lb

## 2018-07-08 DIAGNOSIS — Z9889 Other specified postprocedural states: Secondary | ICD-10-CM | POA: Diagnosis not present

## 2018-07-08 DIAGNOSIS — I6523 Occlusion and stenosis of bilateral carotid arteries: Secondary | ICD-10-CM

## 2018-07-08 DIAGNOSIS — I6521 Occlusion and stenosis of right carotid artery: Secondary | ICD-10-CM | POA: Insufficient documentation

## 2018-07-08 NOTE — Patient Instructions (Signed)

## 2018-07-08 NOTE — Progress Notes (Signed)
Chief Complaint: Follow up Extracranial Carotid Artery Stenosis   History of Present Illness  Kyle English is a 67 y.o. male who is s/p right-sided carotid endarterectomy on 04-23-16 by Dr. Donzetta Matters for asymptomatic high-grade stenosis.   He has done very well is never had symptoms.   At last visit there was some evidence of homogeneous plaque at the patch.  He has no limitation to his walking.  He continues to take aspirin daily.    He denies any known history of stroke or TIA. Specifically he denies a history of amaurosis fugax or monocular blindness, unilateral facial drooping, hemiplegia, or receptive or expressive aphasia.    Diabetic: no Tobacco use: non-smoker  Pt meds include: Statin : yes ASA: yes Other anticoagulants/antiplatelets: no   Past Medical History:  Diagnosis Date  . Arthritis   . Cluster headaches    hx of cluster headaches, takes Verapamil  . Esophageal reflux   . History of CEA (carotid endarterectomy) 04/23/2016  . Hyperlipidemia   . Hypertension   . Kidney stones   . Low iron    10 years ago  . Pneumonia   . Sleep apnea    uses cpap  . Stenosis of right carotid artery   . Vitamin D deficiency     Social History Social History   Tobacco Use  . Smoking status: Never Smoker  . Smokeless tobacco: Never Used  Substance Use Topics  . Alcohol use: No  . Drug use: No    Family History Family History  Problem Relation Age of Onset  . Diabetes Mother   . Hypertension Mother   . Cancer Father        prostate  . Hypertension Sister   . CVA Maternal Grandmother 64    Surgical History Past Surgical History:  Procedure Laterality Date  . ANTERIOR CERVICAL DISCECTOMY  10/2003  . APPENDECTOMY    . COLONOSCOPY  2010, 2014  . ENDARTERECTOMY Right 04/23/2016   Procedure: RIGHT CAROTID ARTERY ENDARTERECTOMY;  Surgeon: Waynetta Sandy, MD;  Location: [facility];  Service: Vascular;  Laterality: Right;  . INGUINAL HERNIA REPAIR Right  10/05/2008  . PATCH ANGIOPLASTY Right 04/23/2016   Procedure: WITH 1 cm x 6 cm [place];  Surgeon: Waynetta Sandy, MD;  Location: [facility];  Service: Vascular;  Laterality: Right;  . UMBILICAL HERNIA REPAIR  10/2007    No Known Allergies  Current Outpatient Medications  Medication Sig Dispense Refill  . aspirin 81 MG tablet take 1 tablet by mouth once daily at bedtime    . atorvastatin (LIPITOR) 20 MG tablet take 1 tablet by mouth in the evening    . dutasteride (AVODART) 0.5 MG capsule take 1 cap by mouth every evening    . EPINEPHrine (EPIPEN 2-PAK) 0.3 mg/0.3 mL IJ SOAJ injection use once as needed for allergic reaction as directed by physician    . Fish Oil-Cholecalciferol (FISH OIL + D3) 1200-1000 MG-UNIT CAPS take 1 cap by mouth daily    . fluticasone (FLONASE) 50 MCG/ACT nasal spray use 2 sprays in each nostril daily as needed for allergies    . Glucos-Chondroit-Hyaluron-MSM (GLUCOSAMINE CHONDROITIN JOINT) TABS take 1 tab by mouth twice daily    . ibuprofen (ADVIL,MOTRIN) 200 MG tablet Take 200 mg by mouth every 6 (six) hours as needed for moderate pain.    . Multiple Vitamins-Minerals (MULTI ADULT GUMMIES) CHEW take 2 chewable tablets by mouth daily    . omeprazole (PRILOSEC OTC)  20 MG tablet take 1 tablet by mouth every morning    . sildenafil (REVATIO) 20 MG tablet take 2-5 tablets by mouth once as needed for erectile dysfunction    . verapamil (CALAN-SR) 240 MG CR tablet take 1 tablet by mouth every morning     No current facility-administered medications for this visit.     Review of Systems : See HPI for pertinent positives and negatives.  Physical Examination  Vitals:   07/08/18 1155  BP: 127/79  Pulse: 73  Resp: 20  Temp: (!) 97.5 F (36.4 C)  SpO2: 98%  Weight: 214 lb (97.1 kg)  Height: 5\' 10"  (1.778 m)   Body mass index is 30.71 kg/m.  General: WDWN obese male in NAD GAIT: normal Eyes: PERRLA HENT: No gross abnormalities.   Pulmonary:  Respirations are non-labored, good air movement in all fields, CTAB, no rales, rhonchi, or wheezes. Cardiac: regular rhythm, no detected murmur.  VASCULAR EXAM Carotid Bruits Right Left   Negative Negative     Abdominal aortic pulse is not palpable. Radial pulses are 2+ palpable and equal.                                                                                                                            LE Pulses Right Left       POPLITEAL  not palpable   not palpable       POSTERIOR TIBIAL  2+ palpable   2+ palpable        DORSALIS PEDIS      ANTERIOR TIBIAL 2+ palpable  2+ palpable     Gastrointestinal: soft, nontender, BS WNL, no r/g, no palpable masses. Musculoskeletal: no muscle atrophy/wasting. M/S 5/5 throughout, extremities without ischemic changes. Skin: No rashes, no ulcers, no cellulitis.   Neurologic:  A&O X 3; appropriate affect, sensation is normal; speech is normal, CN 2-12 intact, pain and light touch intact in extremities, motor exam as listed above. Psychiatric: Normal thought content, mood appropriate to clinical situation.    Assessment: Kyle English is a 67 y.o. male who is s/p right-sided carotid endarterectomy on 04-23-16 by Dr. Donzetta Matters for asymptomatic high-grade stenosis.   He does not have DM and has never used tobacco. He takes a daily statin and ASA, and stays physically active.     DATA Carotid Duplex (07-08-18): Right Carotid: Velocities in the right ICA are consistent with a 1-39% stenosis. Left Carotid: Velocities in the left ICA are consistent with a 1-39% stenosis. Bilateral vertebral artery flow is antegrade.  Bilateral subclavian artery waveforms are normal.  No change compared to the exam on 07-05-17.    Plan: Follow-up in 1 year with Carotid Duplex scan.   I discussed in depth with the patient the nature of atherosclerosis, and emphasized the importance of maximal medical management including strict control of  blood pressure, blood glucose, and lipid levels, obtaining regular exercise, and continued cessation of smoking.  The patient is  aware that without maximal medical management the underlying atherosclerotic disease process will progress, limiting the benefit of any interventions. The patient was given information about stroke prevention and what symptoms should prompt the patient to seek immediate medical care. Thank you for allowing [place] to participate in this patient's care.  Clemon Chambers, RN, MSN, FNP-C [facility] Office: [phone]  Clinic Physician: Early  07/08/18 12:18 PM

## 2018-11-24 DIAGNOSIS — G44009 Cluster headache syndrome, unspecified, not intractable: Secondary | ICD-10-CM | POA: Diagnosis not present

## 2018-11-24 DIAGNOSIS — N400 Enlarged prostate without lower urinary tract symptoms: Secondary | ICD-10-CM | POA: Diagnosis not present

## 2018-11-24 DIAGNOSIS — R7309 Other abnormal glucose: Secondary | ICD-10-CM | POA: Diagnosis not present

## 2018-11-24 DIAGNOSIS — E559 Vitamin D deficiency, unspecified: Secondary | ICD-10-CM | POA: Diagnosis not present

## 2018-11-24 DIAGNOSIS — E782 Mixed hyperlipidemia: Secondary | ICD-10-CM | POA: Diagnosis not present

## 2018-11-24 DIAGNOSIS — I6521 Occlusion and stenosis of right carotid artery: Secondary | ICD-10-CM | POA: Diagnosis not present

## 2019-04-09 DIAGNOSIS — Z20828 Contact with and (suspected) exposure to other viral communicable diseases: Secondary | ICD-10-CM | POA: Diagnosis not present

## 2019-04-29 DIAGNOSIS — Z23 Encounter for immunization: Secondary | ICD-10-CM | POA: Diagnosis not present

## 2019-06-03 DIAGNOSIS — G4733 Obstructive sleep apnea (adult) (pediatric): Secondary | ICD-10-CM | POA: Diagnosis not present

## 2019-06-10 DIAGNOSIS — Z125 Encounter for screening for malignant neoplasm of prostate: Secondary | ICD-10-CM | POA: Diagnosis not present

## 2019-06-10 DIAGNOSIS — G44009 Cluster headache syndrome, unspecified, not intractable: Secondary | ICD-10-CM | POA: Diagnosis not present

## 2019-06-10 DIAGNOSIS — E782 Mixed hyperlipidemia: Secondary | ICD-10-CM | POA: Diagnosis not present

## 2019-06-10 DIAGNOSIS — N5201 Erectile dysfunction due to arterial insufficiency: Secondary | ICD-10-CM | POA: Diagnosis not present

## 2019-06-10 DIAGNOSIS — Z0000 Encounter for general adult medical examination without abnormal findings: Secondary | ICD-10-CM | POA: Diagnosis not present

## 2019-06-10 DIAGNOSIS — I10 Essential (primary) hypertension: Secondary | ICD-10-CM | POA: Diagnosis not present

## 2019-06-10 DIAGNOSIS — N400 Enlarged prostate without lower urinary tract symptoms: Secondary | ICD-10-CM | POA: Diagnosis not present

## 2019-06-10 DIAGNOSIS — I6521 Occlusion and stenosis of right carotid artery: Secondary | ICD-10-CM | POA: Diagnosis not present

## 2019-06-10 DIAGNOSIS — E559 Vitamin D deficiency, unspecified: Secondary | ICD-10-CM | POA: Diagnosis not present

## 2019-07-06 DIAGNOSIS — Z125 Encounter for screening for malignant neoplasm of prostate: Secondary | ICD-10-CM | POA: Diagnosis not present

## 2019-07-06 DIAGNOSIS — Z136 Encounter for screening for cardiovascular disorders: Secondary | ICD-10-CM | POA: Diagnosis not present

## 2019-07-06 DIAGNOSIS — Z79899 Other long term (current) drug therapy: Secondary | ICD-10-CM | POA: Diagnosis not present

## 2019-07-06 DIAGNOSIS — Z0000 Encounter for general adult medical examination without abnormal findings: Secondary | ICD-10-CM | POA: Diagnosis not present

## 2019-07-21 DIAGNOSIS — N3943 Post-void dribbling: Secondary | ICD-10-CM | POA: Diagnosis not present

## 2019-07-21 DIAGNOSIS — R972 Elevated prostate specific antigen [PSA]: Secondary | ICD-10-CM | POA: Diagnosis not present

## 2019-08-10 ENCOUNTER — Ambulatory Visit (HOSPITAL_COMMUNITY): Payer: PPO

## 2019-08-10 ENCOUNTER — Ambulatory Visit: Payer: PPO

## 2019-08-14 ENCOUNTER — Telehealth (HOSPITAL_COMMUNITY): Payer: Self-pay

## 2019-08-14 ENCOUNTER — Other Ambulatory Visit: Payer: Self-pay

## 2019-08-14 DIAGNOSIS — I6523 Occlusion and stenosis of bilateral carotid arteries: Secondary | ICD-10-CM

## 2019-08-14 NOTE — Telephone Encounter (Signed)

## 2019-08-17 ENCOUNTER — Ambulatory Visit: Payer: PPO | Admitting: Physician Assistant

## 2019-08-17 ENCOUNTER — Other Ambulatory Visit: Payer: Self-pay

## 2019-08-17 ENCOUNTER — Ambulatory Visit (HOSPITAL_COMMUNITY)
Admission: RE | Admit: 2019-08-17 | Discharge: 2019-08-17 | Disposition: A | Discharge status: Home / Self Care | Payer: PPO | Source: Ambulatory Visit | Attending: Surgery | Admitting: Surgery

## 2019-08-17 VITALS — BP 154/90 | HR 85 | Temp 97.4°F | Resp 18 | Ht 69.0 in | Wt 214.0 lb

## 2019-08-17 DIAGNOSIS — I6523 Occlusion and stenosis of bilateral carotid arteries: Secondary | ICD-10-CM | POA: Insufficient documentation

## 2019-08-17 NOTE — Progress Notes (Signed)
Established Carotid Patient   History of Present Illness   Kyle English is a 69 y.o. ([birth year]) male who presents for routine follow-up of carotid artery stenosis.  He is status post right carotid endarterectomy for asymptomatic high-grade stenosis by Dr. Donzetta Matters 03/2016.  Over the past year he denies any strokelike symptoms including slurring speech, changes in vision, or one-sided weakness.  He is taking an aspirin and statin daily.  He denies tobacco use.  He follows regularly with PCP for management of hypertension and hyperlipidemia.   Current Outpatient Medications  Medication Sig Dispense Refill  . aspirin 81 MG tablet take 1 tablet by mouth once daily at bedtime    . atorvastatin (LIPITOR) 20 MG tablet take 1 tablet by mouth in the evening    . dutasteride (AVODART) 0.5 MG capsule take 1 cap by mouth every evening    . EPINEPHrine (EPIPEN 2-PAK) 0.3 mg/0.3 mL IJ SOAJ injection use once as needed for allergic reaction as directed by physician    . Fish Oil-Cholecalciferol (FISH OIL + D3) 1200-1000 MG-UNIT CAPS take 1 cap by mouth daily    . fluticasone (FLONASE) 50 MCG/ACT nasal spray use 2 sprays in each nostril daily as needed for allergies    . Glucos-Chondroit-Hyaluron-MSM (GLUCOSAMINE CHONDROITIN JOINT) TABS take 1 tab by mouth twice daily    . Multiple Vitamins-Minerals (MULTI ADULT GUMMIES) CHEW take 2 chewable tablets by mouth daily    . omeprazole (PRILOSEC OTC) 20 MG tablet take 1 tablet by mouth every morning    . sildenafil (REVATIO) 20 MG tablet take 2-5 tablets by mouth once as needed for erectile dysfunction    . verapamil (CALAN-SR) 240 MG CR tablet take 1 tablet by mouth every morning    . ibuprofen (ADVIL,MOTRIN) 200 MG tablet Take 200 mg by mouth every 6 (six) hours as needed for moderate pain.     No current facility-administered medications for this visit.    On ROS today: 10 system ROS is negative unless otherwise noted in HPI   Physical Examination     Vitals:   08/17/19 1131 08/17/19 1135  BP: (!) 153/88 (!) 154/90  Pulse: 85 85  Resp: 18   Temp: (!) 97.4 F (36.3 C)   TempSrc: Temporal   SpO2: 98%   Weight: 214 lb (97.1 kg)   Height: 5\' 9"  (1.753 m)    Body mass index is 31.6 kg/m.  General Alert, O x 3, WD, NAD  Neck Supple, mid-line trachea, Neck incision healed  Pulmonary Sym exp, good B air movt, CTA B  Cardiac RRR, Nl S1, S2,  Vascular Vessel Right Left  Radial Palpable Palpable    Musculo- skeletal M/S 5/5 throughout  , Extremities without ischemic changes    Neurologic Cranial nerves 2-12 intact , Pain and light touch intact in extremities , Motor exam as listed above    Non-Invasive Vascular Imaging   B Carotid Duplex :   R ICA stenosis:  1-39%  R VA:  patent and antegrade  L ICA stenosis:  1-39%  L VA:  patent and antegrade   Medical Decision Making   Kyle English is a 69 y.o. male who presents for follow-up of carotid artery stenosis   Right CEA surgery state without hemodynamically significant stenosis  Left ICA 1 to 39% stenosis  Continue aspirin and statin daily  Follow-up regularly with PCP for management of hypertension and hyperlipidemia  Recheck carotid duplex annually per protocol  Kyle Ligas PA-C [facility] Office: [phone]  Clinic MD: Kyle English

## 2019-08-18 DIAGNOSIS — N289 Disorder of kidney and ureter, unspecified: Secondary | ICD-10-CM | POA: Diagnosis not present

## 2019-08-19 ENCOUNTER — Other Ambulatory Visit: Payer: Self-pay | Admitting: *Deleted

## 2019-08-19 DIAGNOSIS — I6523 Occlusion and stenosis of bilateral carotid arteries: Secondary | ICD-10-CM

## 2019-08-27 ENCOUNTER — Ambulatory Visit: Payer: PPO

## 2019-09-03 DIAGNOSIS — C61 Malignant neoplasm of prostate: Secondary | ICD-10-CM | POA: Diagnosis not present

## 2019-09-03 DIAGNOSIS — R972 Elevated prostate specific antigen [PSA]: Secondary | ICD-10-CM | POA: Diagnosis not present

## 2019-09-03 DIAGNOSIS — D075 Carcinoma in situ of prostate: Secondary | ICD-10-CM | POA: Diagnosis not present

## 2019-09-04 ENCOUNTER — Ambulatory Visit: Payer: PPO | Attending: Internal Medicine

## 2019-09-04 DIAGNOSIS — Z23 Encounter for immunization: Secondary | ICD-10-CM | POA: Insufficient documentation

## 2019-09-04 NOTE — Progress Notes (Signed)
   Covid-19 Vaccination Clinic  Name:  Kyle English    MRN: [id number] DOB: [birth year]  09/04/2019  [name] was observed post Covid-19 immunization for 15 minutes without incidence. He was provided with Vaccine Information Sheet and instruction to access the V-Safe system.   [name] was instructed to call 911 with any severe reactions post vaccine: [name] Kitchen Difficulty breathing  . Swelling of your face and throat  . A fast heartbeat  . A bad rash all over your body  . Dizziness and weakness    Immunizations Administered    Name Date Dose VIS Date Route   Pfizer COVID-19 Vaccine 09/04/2019  4:02 PM 0.3 mL 07/10/2019 Intramuscular   Manufacturer: [place]   Lot: [id number]   [place]: [id number]

## 2019-09-10 ENCOUNTER — Ambulatory Visit: Payer: PPO

## 2019-09-10 ENCOUNTER — Encounter (HOSPITAL_COMMUNITY): Payer: PPO

## 2019-09-10 DIAGNOSIS — N5201 Erectile dysfunction due to arterial insufficiency: Secondary | ICD-10-CM | POA: Diagnosis not present

## 2019-09-10 DIAGNOSIS — C61 Malignant neoplasm of prostate: Secondary | ICD-10-CM | POA: Diagnosis not present

## 2019-09-11 ENCOUNTER — Encounter: Payer: Self-pay | Admitting: *Deleted

## 2019-09-14 ENCOUNTER — Other Ambulatory Visit (HOSPITAL_COMMUNITY): Payer: Self-pay | Admitting: Urology

## 2019-09-14 DIAGNOSIS — C61 Malignant neoplasm of prostate: Secondary | ICD-10-CM

## 2019-09-17 ENCOUNTER — Ambulatory Visit: Payer: PPO

## 2019-09-22 ENCOUNTER — Ambulatory Visit: Payer: PPO | Admitting: Radiation Oncology

## 2019-09-22 ENCOUNTER — Telehealth: Payer: Self-pay | Admitting: Radiation Oncology

## 2019-09-24 DIAGNOSIS — C61 Malignant neoplasm of prostate: Secondary | ICD-10-CM | POA: Diagnosis not present

## 2019-09-24 DIAGNOSIS — N200 Calculus of kidney: Secondary | ICD-10-CM | POA: Diagnosis not present

## 2019-09-25 ENCOUNTER — Encounter (HOSPITAL_COMMUNITY)
Admission: RE | Admit: 2019-09-25 | Discharge: 2019-09-25 | Disposition: A | Discharge status: Home / Self Care | Payer: PPO | Source: Ambulatory Visit | Attending: Urology | Admitting: Urology

## 2019-09-25 ENCOUNTER — Other Ambulatory Visit: Payer: Self-pay

## 2019-09-25 DIAGNOSIS — C61 Malignant neoplasm of prostate: Secondary | ICD-10-CM | POA: Insufficient documentation

## 2019-09-25 MED ORDER — TECHNETIUM TC 99M MEDRONATE IV KIT
20.8000 | PACK | Freq: Once | INTRAVENOUS | Status: AC
  Administered 2019-09-25: 20.8 via INTRAVENOUS

## 2019-09-28 ENCOUNTER — Encounter: Payer: Self-pay | Admitting: Radiation Oncology

## 2019-09-28 DIAGNOSIS — I10 Essential (primary) hypertension: Secondary | ICD-10-CM | POA: Insufficient documentation

## 2019-09-28 DIAGNOSIS — I6521 Occlusion and stenosis of right carotid artery: Secondary | ICD-10-CM | POA: Insufficient documentation

## 2019-09-28 DIAGNOSIS — E559 Vitamin D deficiency, unspecified: Secondary | ICD-10-CM | POA: Insufficient documentation

## 2019-09-28 DIAGNOSIS — E782 Mixed hyperlipidemia: Secondary | ICD-10-CM | POA: Insufficient documentation

## 2019-09-28 DIAGNOSIS — N400 Enlarged prostate without lower urinary tract symptoms: Secondary | ICD-10-CM | POA: Insufficient documentation

## 2019-09-28 DIAGNOSIS — G44009 Cluster headache syndrome, unspecified, not intractable: Secondary | ICD-10-CM | POA: Insufficient documentation

## 2019-09-28 DIAGNOSIS — J309 Allergic rhinitis, unspecified: Secondary | ICD-10-CM | POA: Insufficient documentation

## 2019-09-28 DIAGNOSIS — N529 Male erectile dysfunction, unspecified: Secondary | ICD-10-CM | POA: Insufficient documentation

## 2019-09-28 HISTORY — PX: COLONOSCOPY: SHX174

## 2019-09-28 NOTE — Progress Notes (Signed)
GU Location of Tumor / Histology: prostatic adenocarcinoma  If Prostate Cancer, Gleason Score is (4 + 4) and PSA is (5.12). Prostate volume: 19.43 g  Kyle English's father had a history of prostate cancer. Patient has a history of nephrolithiasis requiring ureteroscopy. Last stone was 2000. Unfortunately, the patient's PSA has been slowly climbing since 2016.     Biopsies of prostate (if applicable) revealed:    Past/Anticipated interventions by urology, if any: ureteroscopy, prostate biopsy, CT abd/pelvis, bone scan (uptake in bilateral posterior ribs), referral to discuss radiation therapy options  Past/Anticipated interventions by medical oncology, if any: no  Weight changes, if any: denies  Bowel/Bladder complaints, if any: IPSS 3. SHIM 14. Reports post void dribble. Denies dysuria or hematuria. Responsive intermittently to sildenafil. Denies any bowel complaints.   Nausea/Vomiting, if any: no  Pain issues, if any:  Denies   SAFETY ISSUES: Prior radiation? denies Pacemaker/ICD? denies Possible current pregnancy? no, male patient Is the patient on methotrexate? denies  Current Complaints / other details:  69 year old male. Married with 3 sons. History of hernia repairs.

## 2019-09-29 ENCOUNTER — Ambulatory Visit
Admission: RE | Admit: 2019-09-29 | Discharge: 2019-09-29 | Disposition: A | Discharge status: Home / Self Care | Payer: PPO | Source: Ambulatory Visit | Attending: Radiation Oncology | Admitting: Radiation Oncology

## 2019-09-29 ENCOUNTER — Other Ambulatory Visit: Payer: Self-pay

## 2019-09-29 ENCOUNTER — Ambulatory Visit: Payer: PPO

## 2019-09-29 ENCOUNTER — Ambulatory Visit: Payer: PPO | Attending: Internal Medicine

## 2019-09-29 ENCOUNTER — Encounter: Payer: Self-pay | Admitting: Radiation Oncology

## 2019-09-29 ENCOUNTER — Telehealth: Payer: PPO | Admitting: Radiation Oncology

## 2019-09-29 VITALS — Ht 69.75 in | Wt 208.0 lb

## 2019-09-29 DIAGNOSIS — R937 Abnormal findings on diagnostic imaging of other parts of musculoskeletal system: Secondary | ICD-10-CM | POA: Diagnosis not present

## 2019-09-29 DIAGNOSIS — C61 Malignant neoplasm of prostate: Secondary | ICD-10-CM | POA: Diagnosis not present

## 2019-09-29 DIAGNOSIS — M899 Disorder of bone, unspecified: Secondary | ICD-10-CM

## 2019-09-29 DIAGNOSIS — Z23 Encounter for immunization: Secondary | ICD-10-CM

## 2019-09-29 DIAGNOSIS — R972 Elevated prostate specific antigen [PSA]: Secondary | ICD-10-CM | POA: Diagnosis not present

## 2019-09-29 DIAGNOSIS — Z8042 Family history of malignant neoplasm of prostate: Secondary | ICD-10-CM | POA: Diagnosis not present

## 2019-09-29 HISTORY — DX: Malignant neoplasm of prostate: C61

## 2019-09-29 NOTE — Progress Notes (Signed)
Radiation Oncology         [phone]) [phone] ________________________________  Initial outpatient Consultation - Conducted via MyChart due to current COVID-19 concerns for limiting patient exposure  Name: Kyle English MRN: [id number]  Date: 09/29/2019  DOB: Jun 25, 1951  [id number], Marjory Lies, MD  Lucas Mallow, MD   REFERRING PHYSICIAN: Lucas Mallow, MD  DIAGNOSIS: 69 y.o. gentleman with Stage T1c adenocarcinoma of the prostate with Gleason score of 4+4, and PSA of 5.12.    ICD-10-CM   1. Malignant neoplasm of prostate ([place])  C61 DG Ribs Bilateral W/Chest  2. Rib lesion  M89.9 DG Ribs Bilateral W/Chest    HISTORY OF PRESENT ILLNESS: Kyle English is a 68 y.o. male with a diagnosis of prostate cancer. He was noted to have an elevated PSA of 5.12 by his primary care physician, Dr. Orland Mustard.  This had been slowly rising from 0.85 in 02/2015.  Accordingly, he was referred for evaluation in urology by Dr. Gloriann Loan on 07/21/2019,  digital rectal examination was performed at that time revealing no nodules.  The patient proceeded to transrectal ultrasound with 12 biopsies of the prostate on 09/03/2019.  The prostate volume measured 19.43 cc.  Out of 12 core biopsies, 1 was positive.  The maximum Gleason score was 4+4, and this was seen in the left apex.  He underwent abdomen/pelvis CT on 09/24/2019 showing no evidence of metastatic disease or other acute findings. He also underwent bone scan on 09/25/2019 which showed increased uptake in the posterior right eighth rib and posterior left 11th rib, suspicious for osseous metastases. He denies any previous trauma to his ribs that he can recall.  The patient reviewed the biopsy results with his urologist and he has kindly been referred today for discussion of potential radiation treatment options.  PREVIOUS RADIATION THERAPY: No  PAST MEDICAL HISTORY:  Past Medical History:  Diagnosis Date  . Arthritis   . Cluster headaches    hx of cluster  headaches, takes Verapamil  . Esophageal reflux   . History of CEA (carotid endarterectomy) 04/23/2016  . Hyperlipidemia   . Hypertension   . Kidney stones   . Low iron    10 years ago  . Pneumonia   . Prostate cancer ([place])   . Sleep apnea    uses cpap  . Stenosis of right carotid artery   . Vitamin D deficiency       PAST SURGICAL HISTORY: Past Surgical History:  Procedure Laterality Date  . ANTERIOR CERVICAL DISCECTOMY  10/2003  . APPENDECTOMY    . COLONOSCOPY  2010, 2014  . ENDARTERECTOMY Right 04/23/2016   Procedure: RIGHT CAROTID ARTERY ENDARTERECTOMY;  Surgeon: Waynetta Sandy, MD;  Location: [facility];  Service: Vascular;  Laterality: Right;  . INGUINAL HERNIA REPAIR Right 10/05/2008  . PATCH ANGIOPLASTY Right 04/23/2016   Procedure: WITH 1 cm x 6 cm [place];  Surgeon: Waynetta Sandy, MD;  Location: [facility];  Service: Vascular;  Laterality: Right;  . PROSTATE BIOPSY    . UMBILICAL HERNIA REPAIR  10/2007    FAMILY HISTORY:  Family History  Problem Relation Age of Onset  . Diabetes Mother   . Hypertension Mother   . Prostate cancer Father        died at the age of 17  . Hypertension Sister   . CVA Maternal Grandmother 64  . Cancer Maternal Uncle        Uncertain  . Breast cancer Neg Hx   .  Colon cancer Neg Hx   . Pancreatic cancer Neg Hx     SOCIAL HISTORY:  Social History   Socioeconomic History  . Marital status: Married    Spouse name: Not on file  . Number of children: 3  . Years of education: Not on file  . Highest education level: Not on file  Occupational History  . Not on file  Tobacco Use  . Smoking status: Never Smoker  . Smokeless tobacco: Never Used  Substance and Sexual Activity  . Alcohol use: No  . Drug use: No  . Sexual activity: Not Currently  Other Topics Concern  . Not on file  Social History Narrative  . Not on file   Social Determinants of Health   Financial Resource Strain:   . Difficulty  of Paying Living Expenses: Not on file  Food Insecurity:   . Worried About Charity fundraiser in the Last Year: Not on file  . Ran Out of Food in the Last Year: Not on file  Transportation Needs:   . Lack of Transportation (Medical): Not on file  . Lack of Transportation (Non-Medical): Not on file  Physical Activity:   . Days of Exercise per Week: Not on file  . Minutes of Exercise per Session: Not on file  Stress:   . Feeling of Stress : Not on file  Social Connections:   . Frequency of Communication with Friends and Family: Not on file  . Frequency of Social Gatherings with Friends and Family: Not on file  . Attends Religious Services: Not on file  . Active Member of Clubs or Organizations: Not on file  . Attends Archivist Meetings: Not on file  . Marital Status: Not on file  Intimate Partner Violence:   . Fear of Current or Ex-Partner: Not on file  . Emotionally Abused: Not on file  . Physically Abused: Not on file  . Sexually Abused: Not on file    ALLERGIES: Patient has no known allergies.  MEDICATIONS:  Current Outpatient Medications  Medication Sig Dispense Refill  . aspirin 81 MG tablet take 1 tablet by mouth once daily at bedtime    . atorvastatin (LIPITOR) 40 MG tablet Take 40 mg by mouth daily.    [name] Kitchen dutasteride (AVODART) 0.5 MG capsule take 1 cap by mouth every evening    . Fish Oil-Cholecalciferol (FISH OIL + D3) 1200-1000 MG-UNIT CAPS take 1 cap by mouth daily    . fluticasone (FLONASE) 50 MCG/ACT nasal spray use 2 sprays in each nostril daily as needed for allergies    . Glucos-Chondroit-Hyaluron-MSM (GLUCOSAMINE CHONDROITIN JOINT) TABS take 1 tab by mouth twice daily    . Multiple Vitamins-Minerals (MULTI ADULT GUMMIES) CHEW take 2 chewable tablets by mouth daily    . niacin 500 MG tablet Take 500 mg by mouth at bedtime. Taking one tablet once in the morning.    [name] Kitchen omeprazole (PRILOSEC OTC) 20 MG tablet take 1 tablet by mouth every morning    .  sildenafil (REVATIO) 20 MG tablet take 2-5 tablets by mouth once as needed for erectile dysfunction    . verapamil (CALAN-SR) 240 MG CR tablet take 1 tablet by mouth every morning    . EPINEPHrine (EPIPEN 2-PAK) 0.3 mg/0.3 mL IJ SOAJ injection use once as needed for allergic reaction as directed by physician    . SHINGRIX injection      No current facility-administered medications for this encounter.    REVIEW OF SYSTEMS:  On review of systems, the patient reports that he is doing well overall. He denies any chest pain, shortness of breath, cough, fevers, chills, night sweats, unintended weight changes. He denies any bowel disturbances, and denies abdominal pain, nausea or vomiting. He denies any new musculoskeletal or joint aches or pains. His IPSS was 3, indicating mild urinary symptoms. He reports post-void dribble. His SHIM was 14, indicating he has moderate erectile dysfunction. He reports intermittent response to sildenafil. A complete review of systems is obtained and is otherwise negative.    PHYSICAL EXAM:  Wt Readings from Last 3 Encounters:  09/29/19 208 lb (94.3 kg)  08/17/19 214 lb (97.1 kg)  07/08/18 214 lb (97.1 kg)   Temp Readings from Last 3 Encounters:  08/17/19 (!) 97.4 F (36.3 C) (Temporal)  07/08/18 (!) 97.5 F (36.4 C)  07/05/17 99.1 F (37.3 C) (Oral)   BP Readings from Last 3 Encounters:  08/17/19 (!) 154/90  07/08/18 127/79  07/05/17 136/82   Pulse Readings from Last 3 Encounters:  08/17/19 85  07/08/18 73  07/05/17 80   Pain Assessment Pain Score: 0-No pain/10  In general this is a well appearing Caucasian gentleman in no acute distress. He's alert and oriented x4 and appropriate throughout the examination. Cardiopulmonary assessment is negative for acute distress and he exhibits normal effort.    KPS = 100  100 - Normal; no complaints; no evidence of disease. 90   - Able to carry on normal activity; minor signs or symptoms of disease. 80   -  Normal activity with effort; some signs or symptoms of disease. 50   - Cares for self; unable to carry on normal activity or to do active work. 60   - Requires occasional assistance, but is able to care for most of his personal needs. 50   - Requires considerable assistance and frequent medical care. 53   - Disabled; requires special care and assistance. 67   - Severely disabled; English admission is indicated although death not imminent. 56   - Very sick; English admission necessary; active supportive treatment necessary. 10   - Moribund; fatal processes progressing rapidly. 0     - Dead  Karnofsky DA, Abelmann [place], Craver LS and Burchenal [facility] [phone]) The use of the nitrogen mustards in the palliative treatment of carcinoma: with particular reference to bronchogenic carcinoma Cancer 1 634-56  LABORATORY DATA:  Lab Results  Component Value Date   WBC 7.4 04/24/2016   HGB 12.9 (L) 04/24/2016   HCT 38.9 (L) 04/24/2016   MCV 94.9 04/24/2016   PLT 157 04/24/2016   Lab Results  Component Value Date   NA 139 04/24/2016   K 3.8 04/24/2016   CL 105 04/24/2016   CO2 25 04/24/2016   Lab Results  Component Value Date   ALT 29 04/18/2016   AST 26 04/18/2016   ALKPHOS 87 04/18/2016   BILITOT 1.1 04/18/2016     RADIOGRAPHY: DG Ribs Bilateral W/Chest  Result Date: 10/01/2019 CLINICAL DATA:  Increased uptake right eighth and left eleventh ribs on bone scan EXAM: BILATERAL RIBS AND CHEST - 4+ VIEW COMPARISON:  None. FINDINGS: No fracture or other bone lesions are seen involving the ribs. There is no evidence of pneumothorax or pleural effusion. Both lungs are clear. Heart size and mediastinal contours are within normal limits. IMPRESSION: No corresponding abnormality identified. Electronically Signed   By: Macy Mis M.D.   On: 10/01/2019 16:12   NM Bone Scan Whole Body  Result Date: 09/25/2019 CLINICAL DATA:  Prostate cancer, PSA 5.12 EXAM: NUCLEAR MEDICINE WHOLE BODY BONE SCAN TECHNIQUE:  Whole body anterior and posterior images were obtained approximately 3 hours after intravenous injection of radiopharmaceutical. RADIOPHARMACEUTICALS:  20.8 mCi Technetium-76m MDP IV COMPARISON:  None Correlation: CT abdomen and pelvis 04/23/2020 FINDINGS: Uptake at posterolateral RIGHT eighth rib and posterior LEFT eleventh rib suspicious for metastases. Uptake at RIGHT eighth costovertebral junction, nonspecific. No definite additional sites of worrisome tracer uptake are identified. Degenerative type uptake at the shoulders and elbows. Expected urinary tract and soft tissue distribution of tracer. IMPRESSION: Uptake at BILATERAL posterior ribs concerning for osseous metastases. Electronically Signed   By: Lavonia Dana M.D.   On: 09/25/2019 18:32      IMPRESSION/PLAN: This visit was conducted via MyChart to spare the patient unnecessary potential exposure in the healthcare setting during the current COVID-19 pandemic. 1. 69 y.o. gentleman with Stage T1c adenocarcinoma of the prostate with Gleason Score of 4+4, and PSA of 5.12. We discussed the patient's workup and outlined the nature of prostate cancer in this setting. The patient's T stage, Gleason's score, and PSA put him into the high risk group. Accordingly, he is eligible for a variety of potential treatment options including prostatectomy or LT-ADT in combination with either 8 weeks of external radiation or 5 weeks of external radiation combined with a brachytherapy boost. We discussed the available radiation techniques, and focused on the details and logistics of delivery. We discussed and outlined the risks, benefits, short and long-term effects associated with radiotherapy and compared and contrasted these with prostatectomy. We discussed the role of SpaceOAR in reducing the rectal toxicity associated with radiotherapy. We also detailed the role of ADT in the treatment of high risk prostate cancer and outlined the associated side effects that could be  expected with this therapy.  We discussed the rationale behind the intentional delay of starting radiation treatment for approximately 2 months after starting ADT to allow for the radiosensitizing effects of this therapy.  He and his wife were encouraged to ask questions that were answered to their stated satisfaction.  He appears to have a good understanding of his disease and our treatment recommendations which are of curative intent.  At the end of the conversation, the patient remains undecided regarding his treatment preference, currently considering prostatectomy versus seed boost with 5 weeks of prostate IMRT concurrent with LT-ADT.  He would like to take more time to consider his options and is scheduled for a follow-up visit with Dr. Gloriann Loan on 10/08/2019 to further discuss prostatectomy and plans to make a decision following that visit. We will share our discussion with Dr. Gloriann Loan and look forward to following along in the care of this very nice gentleman.  We would be more than happy to continue to participate in his care should he elect to proceed with radiation.  He and his wife know that they are welcome to call at anytime with any further questions or concerns regarding radiation treatment options.   Given current concerns for patient exposure during the COVID-19 pandemic, this encounter was conducted via video-enabled MyChart visit. The patient has given verbal consent for this type of encounter. The time spent during this encounter was 75 minutes. The attendants for this meeting include Tyler Pita MD, Ashlyn Bruning PA-C, Katie Daubenspeck- scribe, patient Kyle English and his wife. During the encounter, Tyler Pita MD, Ashlyn Bruning PA-C, and scribe, Wilburn Mylar were located at [place]  Department.  Patient Kyle English and his wife were located at home.    Nicholos Johns, PA-C    Tyler Pita, MD  [facility] Direct Dial: [phone]  Fax: [phone] [email or web address]  Skype  [email or web address]  This document serves as a record of services personally performed by Tyler Pita, MD and Freeman Caldron, PA-C. It was created on their behalf by Wilburn Mylar, a trained medical scribe. The creation of this record is based on the scribe's personal observations and the provider's statements to them. This document has been checked and approved by the attending provider.

## 2019-09-29 NOTE — Progress Notes (Signed)
   Covid-19 Vaccination Clinic  Name:  XYION VIRDEN    MRN: [id number] DOB: February 03, 1951  09/29/2019  Mr. Gerke was observed post Covid-19 immunization for 15 minutes without incident. He was provided with Vaccine Information Sheet and instruction to access the V-Safe system.   Mr. Litwak was instructed to call 911 with any severe reactions post vaccine: [name] Kitchen Difficulty breathing  . Swelling of face and throat  . A fast heartbeat  . A bad rash all over body  . Dizziness and weakness   Immunizations Administered    Name Date Dose VIS Date Route   Pfizer COVID-19 Vaccine 09/29/2019  3:17 PM 0.3 mL 07/10/2019 Intramuscular   Manufacturer: [place]   Lot: [id number]   [place]: [id number]

## 2019-09-30 ENCOUNTER — Telehealth: Payer: Self-pay | Admitting: *Deleted

## 2019-09-30 NOTE — Telephone Encounter (Signed)
Called patient to ask about coming for rib x-rays, patient stated that he wants to come tomorrow- March 4 @ 11 am, informed Radiology

## 2019-10-01 ENCOUNTER — Other Ambulatory Visit: Payer: Self-pay

## 2019-10-01 ENCOUNTER — Ambulatory Visit (HOSPITAL_COMMUNITY)
Admission: RE | Admit: 2019-10-01 | Discharge: 2019-10-01 | Disposition: A | Discharge status: Home / Self Care | Payer: PPO | Source: Ambulatory Visit | Attending: Urology | Admitting: Urology

## 2019-10-01 ENCOUNTER — Encounter: Payer: Self-pay | Admitting: Medical Oncology

## 2019-10-01 DIAGNOSIS — C61 Malignant neoplasm of prostate: Secondary | ICD-10-CM | POA: Diagnosis not present

## 2019-10-01 DIAGNOSIS — R937 Abnormal findings on diagnostic imaging of other parts of musculoskeletal system: Secondary | ICD-10-CM | POA: Diagnosis not present

## 2019-10-01 DIAGNOSIS — M899 Disorder of bone, unspecified: Secondary | ICD-10-CM | POA: Insufficient documentation

## 2019-10-02 ENCOUNTER — Telehealth: Payer: Self-pay | Admitting: Urology

## 2019-10-02 NOTE — Telephone Encounter (Signed)
I called and spoke with the patient to share the good news that his rib x-rays did not show any concerning findings for metastatic disease.  Therefore, we will plan to proceed with treatment of curative intent.  He remains undecided regarding his treatment preference and plans to further discuss treatment options with Dr. Gloriann Loan at his upcoming follow-up on 10/08/2019 in hopes of reaching a decision shortly thereafter.  He has my contact information and knows that he is welcome to call with any questions or concerns regarding radiotherapy in the interim. I look forward to hearing back from him regarding his treatment decision and should he elect to proceed with radiotherapy, I am happy to help coordinate his treatment.  Nicholos Johns, MMS, PA-C [facility] [place]: [phone]  Fax: [phone]

## 2019-10-05 ENCOUNTER — Telehealth: Payer: Self-pay | Admitting: Radiation Oncology

## 2019-10-05 NOTE — Telephone Encounter (Signed)
Received message from [employer] that patient phoned after hours requesting an explanation of the xray he had of his ribs. Phoned patient. Patient explains Kyle Caldron, PA-C phoned him shortly after he called on Friday and reviewed the xray with him. Answered additional patient questions to the best of my ability. Educated patient reference I-125 seed implant time and distance restrictions. Patient verbalized understanding of all reviewed and expressed appreciation for the call.

## 2019-10-07 DIAGNOSIS — Z86010 Personal history of colonic polyps: Secondary | ICD-10-CM | POA: Diagnosis not present

## 2019-10-07 DIAGNOSIS — C61 Malignant neoplasm of prostate: Secondary | ICD-10-CM | POA: Diagnosis not present

## 2019-10-08 DIAGNOSIS — Z1159 Encounter for screening for other viral diseases: Secondary | ICD-10-CM | POA: Diagnosis not present

## 2019-10-08 DIAGNOSIS — C61 Malignant neoplasm of prostate: Secondary | ICD-10-CM | POA: Diagnosis not present

## 2019-10-09 ENCOUNTER — Telehealth: Payer: Self-pay | Admitting: Medical Oncology

## 2019-10-09 NOTE — Telephone Encounter (Signed)
Spoke with patient as follow up to consult with Dr. Tammi Klippel 3/2. He was decided on treatment and wanted to meet with Dr. Gloriann Loan 3/11, before making his final decision. He states he has chosen ADT, seed boost with 5 weeks of radiation. He has had several surgeries in the past and does not want to have more surgery. He is scheduled for ADT 3/19. I informed him, Enid Derry will be in contact to schedule the seed implant and radiation. He voiced understanding.

## 2019-10-12 DIAGNOSIS — D123 Benign neoplasm of transverse colon: Secondary | ICD-10-CM | POA: Diagnosis not present

## 2019-10-12 DIAGNOSIS — Z86010 Personal history of colonic polyps: Secondary | ICD-10-CM | POA: Diagnosis not present

## 2019-10-12 DIAGNOSIS — D122 Benign neoplasm of ascending colon: Secondary | ICD-10-CM | POA: Diagnosis not present

## 2019-10-12 DIAGNOSIS — D124 Benign neoplasm of descending colon: Secondary | ICD-10-CM | POA: Diagnosis not present

## 2019-10-13 ENCOUNTER — Telehealth: Payer: Self-pay | Admitting: *Deleted

## 2019-10-13 NOTE — Telephone Encounter (Signed)
CALLED PATIENT TO ASK QUESTIONS, SPOKE WITH PATIENT 

## 2019-10-14 ENCOUNTER — Other Ambulatory Visit: Payer: Self-pay | Admitting: Urology

## 2019-10-14 ENCOUNTER — Telehealth: Payer: Self-pay | Admitting: *Deleted

## 2019-10-14 DIAGNOSIS — D123 Benign neoplasm of transverse colon: Secondary | ICD-10-CM | POA: Diagnosis not present

## 2019-10-14 DIAGNOSIS — D124 Benign neoplasm of descending colon: Secondary | ICD-10-CM | POA: Diagnosis not present

## 2019-10-14 DIAGNOSIS — D122 Benign neoplasm of ascending colon: Secondary | ICD-10-CM | POA: Diagnosis not present

## 2019-10-14 NOTE — Telephone Encounter (Signed)
Called patient to inform of implant date, spoke with patient and he is aware of this procedure, date and time

## 2019-10-16 DIAGNOSIS — C61 Malignant neoplasm of prostate: Secondary | ICD-10-CM | POA: Diagnosis not present

## 2019-10-16 DIAGNOSIS — Z5111 Encounter for antineoplastic chemotherapy: Secondary | ICD-10-CM | POA: Diagnosis not present

## 2019-11-18 ENCOUNTER — Telehealth: Payer: Self-pay | Admitting: *Deleted

## 2019-11-18 NOTE — Telephone Encounter (Signed)
CALLED PATIENT TO REMIND OF PRE-SEED APPTS. FOR 11/19/19, SPOKE WITH PATIENT AND HE IS AWARE OF THESE APPTS.

## 2019-11-19 ENCOUNTER — Encounter: Payer: Self-pay | Admitting: Medical Oncology

## 2019-11-19 ENCOUNTER — Ambulatory Visit
Admission: RE | Admit: 2019-11-19 | Discharge: 2019-11-19 | Disposition: A | Discharge status: Home / Self Care | Payer: PPO | Source: Ambulatory Visit | Attending: Urology | Admitting: Urology

## 2019-11-19 ENCOUNTER — Other Ambulatory Visit: Payer: Self-pay

## 2019-11-19 ENCOUNTER — Ambulatory Visit (HOSPITAL_COMMUNITY)
Admission: RE | Admit: 2019-11-19 | Discharge: 2019-11-19 | Disposition: A | Discharge status: Home / Self Care | Payer: PPO | Source: Ambulatory Visit | Attending: Urology | Admitting: Urology

## 2019-11-19 ENCOUNTER — Encounter (HOSPITAL_COMMUNITY)
Admission: RE | Admit: 2019-11-19 | Discharge: 2019-11-19 | Disposition: A | Discharge status: Home / Self Care | Payer: PPO | Source: Ambulatory Visit | Attending: Urology | Admitting: Urology

## 2019-11-19 ENCOUNTER — Ambulatory Visit
Admission: RE | Admit: 2019-11-19 | Discharge: 2019-11-19 | Disposition: A | Discharge status: Home / Self Care | Payer: PPO | Source: Ambulatory Visit | Attending: Radiation Oncology | Admitting: Radiation Oncology

## 2019-11-19 ENCOUNTER — Other Ambulatory Visit: Payer: Self-pay | Admitting: Urology

## 2019-11-19 DIAGNOSIS — C61 Malignant neoplasm of prostate: Secondary | ICD-10-CM

## 2019-11-19 DIAGNOSIS — Z01818 Encounter for other preprocedural examination: Secondary | ICD-10-CM | POA: Diagnosis not present

## 2019-11-19 NOTE — Progress Notes (Signed)
  Radiation Oncology         [phone] ________________________________  Name: JARRY MIHALEK MRN: [id number]  Date: 11/19/2019  DOB: [birth year]  SIMULATION AND TREATMENT PLANNING NOTE PUBIC ARCH STUDY  [id number], Marjory Lies, MD  Lucas Mallow, MD  DIAGNOSIS: 69 y.o. gentleman with Stage T1c adenocarcinoma of the prostate with Gleason score of 4+4, and PSA of 5.12 Oncology History  Malignant neoplasm of prostate ([place])  09/03/2019 Cancer Staging   Staging form: Prostate, AJCC 8th Edition - Clinical stage from 09/03/2019: Stage IIC (cT1c, cN0, cM0, PSA: 5.1, Grade Group: 4) - Signed by Freeman Caldron, PA-C on 09/29/2019   09/29/2019 Initial Diagnosis   Malignant neoplasm of prostate ([place])       ICD-10-CM   1. Malignant neoplasm of prostate ([place])  C61     COMPLEX SIMULATION:  The patient presented today for evaluation for possible prostate seed implant. He was brought to the radiation planning suite and placed supine on the CT couch. A 3-dimensional image study set was obtained in upload to the planning computer. There, on each axial slice, I contoured the prostate gland. Then, using three-dimensional radiation planning tools I reconstructed the prostate in view of the structures from the transperineal needle pathway to assess for possible pubic arch interference. In doing so, I did not appreciate any pubic arch interference. Also, the patient's prostate volume was estimated based on the drawn structure. The volume was 19 cc.  Given the pubic arch appearance and prostate volume, patient remains a good candidate to proceed with prostate seed implant. Today, he freely provided informed written consent to proceed.    PLAN: The patient will undergo prostate seed implant.   ________________________________  Sheral Apley. Tammi Klippel, M.D.

## 2019-11-27 DIAGNOSIS — C61 Malignant neoplasm of prostate: Secondary | ICD-10-CM | POA: Diagnosis not present

## 2019-12-03 ENCOUNTER — Encounter: Payer: Self-pay | Admitting: Medical Oncology

## 2019-12-08 DIAGNOSIS — C61 Malignant neoplasm of prostate: Secondary | ICD-10-CM | POA: Diagnosis not present

## 2019-12-10 DIAGNOSIS — E782 Mixed hyperlipidemia: Secondary | ICD-10-CM | POA: Diagnosis not present

## 2019-12-10 DIAGNOSIS — R945 Abnormal results of liver function studies: Secondary | ICD-10-CM | POA: Diagnosis not present

## 2019-12-10 DIAGNOSIS — I10 Essential (primary) hypertension: Secondary | ICD-10-CM | POA: Diagnosis not present

## 2019-12-10 DIAGNOSIS — E559 Vitamin D deficiency, unspecified: Secondary | ICD-10-CM | POA: Diagnosis not present

## 2019-12-10 DIAGNOSIS — C61 Malignant neoplasm of prostate: Secondary | ICD-10-CM | POA: Diagnosis not present

## 2019-12-18 ENCOUNTER — Telehealth: Payer: Self-pay | Admitting: *Deleted

## 2019-12-18 NOTE — Telephone Encounter (Signed)
Called patient to remind of lab and Covid- testing for 12-29-19, spoke with patient and he is aware of this appt.

## 2019-12-21 ENCOUNTER — Other Ambulatory Visit: Payer: Self-pay | Admitting: Family Medicine

## 2019-12-21 DIAGNOSIS — R7989 Other specified abnormal findings of blood chemistry: Secondary | ICD-10-CM

## 2019-12-22 ENCOUNTER — Ambulatory Visit
Admission: RE | Admit: 2019-12-22 | Discharge: 2019-12-22 | Disposition: A | Discharge status: Home / Self Care | Payer: PPO | Source: Ambulatory Visit | Attending: Family Medicine | Admitting: Family Medicine

## 2019-12-22 DIAGNOSIS — K760 Fatty (change of) liver, not elsewhere classified: Secondary | ICD-10-CM | POA: Diagnosis not present

## 2019-12-22 DIAGNOSIS — R7989 Other specified abnormal findings of blood chemistry: Secondary | ICD-10-CM

## 2019-12-22 DIAGNOSIS — K8020 Calculus of gallbladder without cholecystitis without obstruction: Secondary | ICD-10-CM | POA: Diagnosis not present

## 2019-12-24 ENCOUNTER — Encounter (HOSPITAL_BASED_OUTPATIENT_CLINIC_OR_DEPARTMENT_OTHER): Payer: Self-pay | Admitting: Urology

## 2019-12-24 ENCOUNTER — Other Ambulatory Visit: Payer: Self-pay

## 2019-12-24 NOTE — Progress Notes (Signed)
Spoke w/ via phone for pre-op interview--- PT Lab needs dos----  no             Lab results------ getting CBC,CMP,PT/PTT done 12-29-2019 @ 1000;  Current EKG/ CXR, dated 11-19-2019, in epic and chart COVID test ------ 12-29-2019 2 0935 Arrive at ------- 1100 NPO after ------ MN w/ exception clear liquids until 0700 then nothing by mouth (no cream/ milk products) Medications to take morning of surgery ----- Verapamil, Prilosec w/ sips of water Diabetic medication ----- n/a Patient Special Instructions ----- will do one fleet enema am dos and asked to bring cpap/mask/ tubing Pre-Op special Istructions ----- n/a Patient verbalized understanding of instructions that were given at this phone interview. Patient denies shortness of breath, chest pain, fever, cough a this phone interview.  Anesthesia Review: hx HTN,  Carotid stenosis, OSA w/ cpap.  Chart to be reviewed by Konrad Felix PA  PCP: Dr London Pepper Cardiologist : no Vascular:  Dr Donzetta Matters  (lov 08-17-2019 epic) Carotid duplex:  08-17-2019 epic Chest x-ray : 11-19-2019 epic EKG :  11-19-2019 epic Stress test:  Pt stated never had a stress test Echo :  no Cardiac Cath :  no Sleep Study/ CPAP :  YES/ YES Fasting Blood Sugar :      / Checks Blood Sugar -- times a day:   N/A Blood Thinner/ Instructions /Last Dose: NO ASA / Instructions/ Last Dose :  ASA 81 mg/  Pt stated given instructions to stop 5 days prior to surgery/  Pt stated last dose will be 12-25-2019

## 2019-12-29 ENCOUNTER — Other Ambulatory Visit (HOSPITAL_COMMUNITY)
Admission: RE | Admit: 2019-12-29 | Discharge: 2019-12-29 | Disposition: A | Discharge status: Home / Self Care | Payer: PPO | Source: Ambulatory Visit | Attending: Urology | Admitting: Urology

## 2019-12-29 ENCOUNTER — Other Ambulatory Visit (HOSPITAL_COMMUNITY): Payer: PPO

## 2019-12-29 ENCOUNTER — Other Ambulatory Visit: Payer: Self-pay

## 2019-12-29 ENCOUNTER — Encounter (HOSPITAL_COMMUNITY)
Admission: RE | Admit: 2019-12-29 | Discharge: 2019-12-29 | Disposition: A | Discharge status: Home / Self Care | Payer: PPO | Source: Ambulatory Visit | Attending: Urology | Admitting: Urology

## 2019-12-29 ENCOUNTER — Telehealth: Payer: Self-pay | Admitting: *Deleted

## 2019-12-29 DIAGNOSIS — Z20822 Contact with and (suspected) exposure to covid-19: Secondary | ICD-10-CM | POA: Insufficient documentation

## 2019-12-29 DIAGNOSIS — Z01812 Encounter for preprocedural laboratory examination: Secondary | ICD-10-CM | POA: Diagnosis not present

## 2019-12-29 LAB — COMPREHENSIVE METABOLIC PANEL
ALT: 43 U/L (ref 0–44)
AST: 28 U/L (ref 15–41)
Albumin: 4.3 g/dL (ref 3.5–5.0)
Alkaline Phosphatase: 96 U/L (ref 38–126)
Anion gap: 11 (ref 5–15)
BUN: 22 mg/dL (ref 8–23)
CO2: 24 mmol/L (ref 22–32)
Calcium: 9.6 mg/dL (ref 8.9–10.3)
Chloride: 107 mmol/L (ref 98–111)
Creatinine, Ser: 1.16 mg/dL (ref 0.61–1.24)
GFR calc Af Amer: >60 mL/min (ref >60)
GFR calc non Af Amer: >60 mL/min (ref >60)
Glucose, Bld: 109 mg/dL — ABNORMAL HIGH (ref 70–99)
Potassium: 4.3 mmol/L (ref 3.5–5.1)
Sodium: 142 mmol/L (ref 135–145)
Total Bilirubin: 1.2 mg/dL (ref 0.3–1.2)
Total Protein: 7.3 g/dL (ref 6.5–8.1)

## 2019-12-29 LAB — PROTIME-INR
INR: 0.9 (ref 0.8–1.2)
Prothrombin Time: 12.1 seconds (ref 11.4–15.2)

## 2019-12-29 LAB — CBC
HCT: 41.6 % (ref 39.0–52.0)
Hemoglobin: 14.8 g/dL (ref 13.0–17.0)
MCH: 33.3 pg (ref 26.0–34.0)
MCHC: 35.6 g/dL (ref 30.0–36.0)
MCV: 93.7 fL (ref 80.0–100.0)
Platelets: 194 10*3/uL (ref 150–400)
RBC: 4.44 MIL/uL (ref 4.22–5.81)
RDW: 12.8 % (ref 11.5–15.5)
WBC: 5.6 10*3/uL (ref 4.0–10.5)
nRBC: 0 % (ref 0.0–0.2)

## 2019-12-29 LAB — APTT: aPTT: 26 seconds (ref 24–36)

## 2019-12-29 LAB — SARS CORONAVIRUS 2 (TAT 6-24 HRS): SARS Coronavirus 2: NEGATIVE

## 2019-12-29 NOTE — Telephone Encounter (Signed)
CALLED PATIENT TO REMIND OF PROCEDURE FOR 12-30-19, SPOKE WITH PATIENT AND HE IS AWARE OF THIS PROCEDURE

## 2019-12-29 NOTE — Progress Notes (Signed)
Spoke with Konrad Felix pa, patient meets [facility] guidelines, ok to proceed.

## 2019-12-30 ENCOUNTER — Encounter (HOSPITAL_BASED_OUTPATIENT_CLINIC_OR_DEPARTMENT_OTHER): Payer: Self-pay | Admitting: Urology

## 2019-12-30 ENCOUNTER — Ambulatory Visit (HOSPITAL_BASED_OUTPATIENT_CLINIC_OR_DEPARTMENT_OTHER): Payer: PPO | Admitting: Certified Registered"

## 2019-12-30 ENCOUNTER — Ambulatory Visit (HOSPITAL_BASED_OUTPATIENT_CLINIC_OR_DEPARTMENT_OTHER): Payer: PPO | Admitting: Physician Assistant

## 2019-12-30 ENCOUNTER — Ambulatory Visit (HOSPITAL_COMMUNITY): Payer: PPO

## 2019-12-30 ENCOUNTER — Ambulatory Visit (HOSPITAL_BASED_OUTPATIENT_CLINIC_OR_DEPARTMENT_OTHER)
Admission: RE | Admit: 2019-12-30 | Discharge: 2019-12-30 | Disposition: A | Discharge status: Home / Self Care | Payer: PPO | Attending: Urology | Admitting: Urology

## 2019-12-30 ENCOUNTER — Encounter (HOSPITAL_BASED_OUTPATIENT_CLINIC_OR_DEPARTMENT_OTHER)
Admission: RE | Disposition: A | Discharge status: Home / Self Care | Payer: Self-pay | Source: Home / Self Care | Attending: Urology

## 2019-12-30 DIAGNOSIS — Z8249 Family history of ischemic heart disease and other diseases of the circulatory system: Secondary | ICD-10-CM | POA: Insufficient documentation

## 2019-12-30 DIAGNOSIS — Z87442 Personal history of urinary calculi: Secondary | ICD-10-CM | POA: Diagnosis not present

## 2019-12-30 DIAGNOSIS — Z86010 Personal history of colonic polyps: Secondary | ICD-10-CM | POA: Insufficient documentation

## 2019-12-30 DIAGNOSIS — I10 Essential (primary) hypertension: Secondary | ICD-10-CM | POA: Insufficient documentation

## 2019-12-30 DIAGNOSIS — K219 Gastro-esophageal reflux disease without esophagitis: Secondary | ICD-10-CM | POA: Insufficient documentation

## 2019-12-30 DIAGNOSIS — Z79899 Other long term (current) drug therapy: Secondary | ICD-10-CM | POA: Diagnosis not present

## 2019-12-30 DIAGNOSIS — E7800 Pure hypercholesterolemia, unspecified: Secondary | ICD-10-CM | POA: Diagnosis not present

## 2019-12-30 DIAGNOSIS — G4730 Sleep apnea, unspecified: Secondary | ICD-10-CM | POA: Insufficient documentation

## 2019-12-30 DIAGNOSIS — Z833 Family history of diabetes mellitus: Secondary | ICD-10-CM | POA: Diagnosis not present

## 2019-12-30 DIAGNOSIS — E782 Mixed hyperlipidemia: Secondary | ICD-10-CM | POA: Diagnosis not present

## 2019-12-30 DIAGNOSIS — Z8042 Family history of malignant neoplasm of prostate: Secondary | ICD-10-CM | POA: Insufficient documentation

## 2019-12-30 DIAGNOSIS — C61 Malignant neoplasm of prostate: Secondary | ICD-10-CM | POA: Diagnosis not present

## 2019-12-30 HISTORY — DX: Personal history of urinary calculi: Z87.442

## 2019-12-30 HISTORY — PX: RADIOACTIVE SEED IMPLANT: SHX5150

## 2019-12-30 HISTORY — DX: Other specified abnormal findings of blood chemistry: R79.89

## 2019-12-30 HISTORY — PX: SPACE OAR INSTILLATION: SHX6769

## 2019-12-30 HISTORY — DX: Calculus of kidney: N20.0

## 2019-12-30 HISTORY — DX: Occlusion and stenosis of bilateral carotid arteries: I65.23

## 2019-12-30 HISTORY — DX: Obstructive sleep apnea (adult) (pediatric): G47.33

## 2019-12-30 HISTORY — DX: Nocturia: R35.1

## 2019-12-30 HISTORY — DX: Mixed hyperlipidemia: E78.2

## 2019-12-30 HISTORY — DX: Gastro-esophageal reflux disease without esophagitis: K21.9

## 2019-12-30 SURGERY — INSERTION, RADIATION SOURCE, PROSTATE
Anesthesia: General | Site: Prostate

## 2019-12-30 MED ORDER — ARTIFICIAL TEARS OPHTHALMIC OINT
TOPICAL_OINTMENT | OPHTHALMIC | Status: AC
  Filled 2019-12-30: qty 3.5

## 2019-12-30 MED ORDER — MIDAZOLAM HCL 2 MG/2ML IJ SOLN
INTRAMUSCULAR | Status: AC
  Filled 2019-12-30: qty 2

## 2019-12-30 MED ORDER — PHENYLEPHRINE 40 MCG/ML (10ML) SYRINGE FOR IV PUSH (FOR BLOOD PRESSURE SUPPORT)
PREFILLED_SYRINGE | INTRAVENOUS | Status: DC | PRN
  Administered 2019-12-30 (×2): 80 ug via INTRAVENOUS
  Administered 2019-12-30: 40 ug via INTRAVENOUS
  Administered 2019-12-30: 80 ug via INTRAVENOUS
  Administered 2019-12-30: 40 ug via INTRAVENOUS
  Administered 2019-12-30: 80 ug via INTRAVENOUS

## 2019-12-30 MED ORDER — LACTATED RINGERS IV SOLN: INTRAVENOUS | Status: DC

## 2019-12-30 MED ORDER — FENTANYL CITRATE (PF) 100 MCG/2ML IJ SOLN
INTRAMUSCULAR | Status: DC | PRN
  Administered 2019-12-30 (×2): 50 ug via INTRAVENOUS
  Administered 2019-12-30 (×2): 25 ug via INTRAVENOUS
  Administered 2019-12-30: 50 ug via INTRAVENOUS

## 2019-12-30 MED ORDER — SODIUM CHLORIDE FLUSH 0.9 % IV SOLN
INTRAVENOUS | Status: DC | PRN
  Administered 2019-12-30: 10 mL

## 2019-12-30 MED ORDER — MIDAZOLAM HCL 2 MG/2ML IJ SOLN
INTRAMUSCULAR | Status: DC | PRN
  Administered 2019-12-30: 2 mg via INTRAVENOUS

## 2019-12-30 MED ORDER — DEXAMETHASONE SODIUM PHOSPHATE 10 MG/ML IJ SOLN
INTRAMUSCULAR | Status: AC
  Filled 2019-12-30: qty 1

## 2019-12-30 MED ORDER — FENTANYL CITRATE (PF) 100 MCG/2ML IJ SOLN
INTRAMUSCULAR | Status: AC
  Filled 2019-12-30: qty 2

## 2019-12-30 MED ORDER — LIDOCAINE 2% (20 MG/ML) 5 ML SYRINGE
INTRAMUSCULAR | Status: AC
  Filled 2019-12-30: qty 5

## 2019-12-30 MED ORDER — CIPROFLOXACIN IN D5W 400 MG/200ML IV SOLN
INTRAVENOUS | Status: AC
  Filled 2019-12-30: qty 200

## 2019-12-30 MED ORDER — FLEET ENEMA 7-19 GM/118ML RE ENEM: 1.0000 | ENEMA | Freq: Once | RECTAL | Status: DC

## 2019-12-30 MED ORDER — DEXAMETHASONE SODIUM PHOSPHATE 10 MG/ML IJ SOLN
INTRAMUSCULAR | Status: DC | PRN
  Administered 2019-12-30: 10 mg via INTRAVENOUS

## 2019-12-30 MED ORDER — HYDROCODONE-ACETAMINOPHEN 5-325 MG PO TABS: 1.0000 | ORAL_TABLET | ORAL | 0 refills | Status: DC | PRN

## 2019-12-30 MED ORDER — SODIUM CHLORIDE 0.9 % IV SOLN
INTRAVENOUS | Status: AC | PRN
  Administered 2019-12-30: 1000 mL

## 2019-12-30 MED ORDER — LIDOCAINE 2% (20 MG/ML) 5 ML SYRINGE
INTRAMUSCULAR | Status: DC | PRN
  Administered 2019-12-30: 60 mg via INTRAVENOUS
  Administered 2019-12-30: 40 mg via INTRAVENOUS

## 2019-12-30 MED ORDER — PROPOFOL 10 MG/ML IV BOLUS
INTRAVENOUS | Status: DC | PRN
  Administered 2019-12-30: 30 mg via INTRAVENOUS
  Administered 2019-12-30: 150 mg via INTRAVENOUS
  Administered 2019-12-30: 20 mg via INTRAVENOUS
  Administered 2019-12-30: 30 mg via INTRAVENOUS

## 2019-12-30 MED ORDER — PHENYLEPHRINE 40 MCG/ML (10ML) SYRINGE FOR IV PUSH (FOR BLOOD PRESSURE SUPPORT)
PREFILLED_SYRINGE | INTRAVENOUS | Status: AC
  Filled 2019-12-30: qty 10

## 2019-12-30 MED ORDER — ONDANSETRON HCL 4 MG/2ML IJ SOLN: 4.0000 mg | Freq: Once | INTRAMUSCULAR | Status: DC | PRN

## 2019-12-30 MED ORDER — IOHEXOL 300 MG/ML  SOLN
INTRAMUSCULAR | Status: DC | PRN
  Administered 2019-12-30: 7 mL

## 2019-12-30 MED ORDER — ONDANSETRON HCL 4 MG/2ML IJ SOLN
INTRAMUSCULAR | Status: AC
  Filled 2019-12-30: qty 2

## 2019-12-30 MED ORDER — CIPROFLOXACIN IN D5W 400 MG/200ML IV SOLN
400.0000 mg | INTRAVENOUS | Status: AC
  Administered 2019-12-30: 400 mg via INTRAVENOUS

## 2019-12-30 MED ORDER — ONDANSETRON HCL 4 MG/2ML IJ SOLN
INTRAMUSCULAR | Status: DC | PRN
  Administered 2019-12-30: 4 mg via INTRAVENOUS

## 2019-12-30 MED ORDER — PROPOFOL 10 MG/ML IV BOLUS
INTRAVENOUS | Status: AC
  Filled 2019-12-30: qty 40

## 2019-12-30 MED ORDER — MEPERIDINE HCL 25 MG/ML IJ SOLN: 6.2500 mg | INTRAMUSCULAR | Status: DC | PRN

## 2019-12-30 MED ORDER — HYDROMORPHONE HCL 1 MG/ML IJ SOLN: 0.2500 mg | INTRAMUSCULAR | Status: DC | PRN

## 2019-12-30 SURGICAL SUPPLY — 34 items
BAG URINE DRAIN 2000ML AR STRL (UROLOGICAL SUPPLIES) ×4 IMPLANT
BLADE CLIPPER SENSICLIP SURGIC (BLADE) ×4 IMPLANT
CATH FOLEY 2WAY SLVR  5CC 16FR (CATHETERS) ×4
CATH FOLEY 2WAY SLVR 5CC 16FR (CATHETERS) ×2 IMPLANT
CATH ROBINSON RED A/P 20FR (CATHETERS) ×4 IMPLANT
CLOTH BEACON ORANGE TIMEOUT ST (SAFETY) ×4 IMPLANT
CNTNR URN SCR LID CUP LEK RST (MISCELLANEOUS) ×4 IMPLANT
CONT SPEC 4OZ STRL OR WHT (MISCELLANEOUS) ×8
COVER BACK TABLE 60X90IN (DRAPES) ×4 IMPLANT
COVER MAYO STAND STRL (DRAPES) ×4 IMPLANT
DRSG TEGADERM 4X4.75 (GAUZE/BANDAGES/DRESSINGS) ×6 IMPLANT
DRSG TEGADERM 8X12 (GAUZE/BANDAGES/DRESSINGS) ×6 IMPLANT
GAUZE SPONGE 4X4 12PLY STRL (GAUZE/BANDAGES/DRESSINGS) ×2 IMPLANT
GLOVE BIO SURGEON STRL SZ7.5 (GLOVE) ×6 IMPLANT
GLOVE BIO SURGEON STRL SZ8 (GLOVE) ×2 IMPLANT
GLOVE BIOGEL PI IND STRL 7.5 (GLOVE) IMPLANT
GLOVE BIOGEL PI INDICATOR 7.5 (GLOVE) ×4
GLOVE ECLIPSE 7.5 STRL STRAW (GLOVE) ×4 IMPLANT
GLOVE SURG ORTHO 8.5 STRL (GLOVE) ×8 IMPLANT
GOWN STRL REUS W/TWL LRG LVL3 (GOWN DISPOSABLE) ×4 IMPLANT
GOWN STRL REUS W/TWL XL LVL3 (GOWN DISPOSABLE) ×4 IMPLANT
I-Seed AgX100 ×126 IMPLANT
IMPL SPACEOAR VUE SYSTEM (Spacer) IMPLANT
IMPLANT SPACEOAR VUE SYSTEM (Spacer) ×4 IMPLANT
IV NS 1000ML (IV SOLUTION) ×4
IV NS 1000ML BAXH (IV SOLUTION) ×2 IMPLANT
KIT TURNOVER CYSTO (KITS) ×4 IMPLANT
MARKER SKIN DUAL TIP RULER LAB (MISCELLANEOUS) ×4 IMPLANT
SURGILUBE 2OZ TUBE FLIPTOP (MISCELLANEOUS) ×4 IMPLANT
SYR 10ML LL (SYRINGE) ×6 IMPLANT
TOWEL OR 17X26 10 PK STRL BLUE (TOWEL DISPOSABLE) ×4 IMPLANT
TRAY CYSTO PACK (CUSTOM PROCEDURE TRAY) ×4 IMPLANT
UNDERPAD 30X30 (UNDERPADS AND DIAPERS) ×8 IMPLANT
WATER STERILE IRR 500ML POUR (IV SOLUTION) ×4 IMPLANT

## 2019-12-30 NOTE — Transfer of Care (Signed)
Last Vitals:  Vitals Value Taken Time  BP 140/89 12/30/19 1412  Temp    Pulse 74 12/30/19 1415  Resp 11 12/30/19 1415  SpO2 93 % 12/30/19 1415  Vitals shown include unvalidated device data.  Last Pain:  Vitals:   12/30/19 1148  TempSrc: Oral  PainSc: 0-No pain      Patients Stated Pain Goal: 5 (12/30/19 1148)  Immediate Anesthesia Transfer of Care Note  Patient: Kyle English  Procedure(s) Performed: Procedure(s) (LRB): RADIOACTIVE SEED IMPLANT/BRACHYTHERAPY IMPLANT (N/A) SPACE OAR INSTILLATION (N/A)  Patient Location: PACU  Anesthesia Type: General  Level of Consciousness: awake, alert  and oriented  Airway & Oxygen Therapy: Patient Spontanous Breathing and Patient connected to nasal cannula oxygen, oral airway remaining.  Post-op Assessment: Report given to PACU RN and Post -op Vital signs reviewed and stable  Post vital signs: Reviewed and stable  Complications: No apparent anesthesia complications

## 2019-12-30 NOTE — Anesthesia Preprocedure Evaluation (Signed)
Anesthesia Evaluation  Patient identified by MRN, date of birth, ID band Patient awake    Reviewed: Allergy & Precautions, NPO status , Patient's Chart, lab work & pertinent test results  Airway Mallampati: I  TM Distance: >3 FB Neck ROM: Full    Dental   Pulmonary sleep apnea and Continuous Positive Airway Pressure Ventilation ,    Pulmonary exam normal        Cardiovascular hypertension, Pt. on medications Normal cardiovascular exam     Neuro/Psych    GI/Hepatic GERD  Medicated and Controlled,  Endo/Other    Renal/GU      Musculoskeletal   Abdominal   Peds  Hematology   Anesthesia Other Findings   Reproductive/Obstetrics                             Anesthesia Physical Anesthesia Plan  ASA: III  Anesthesia Plan: General   Post-op Pain Management:    Induction: Intravenous  PONV Risk Score and Plan: 2 and Ondansetron and Midazolam  Airway Management Planned: LMA  Additional Equipment:   Intra-op Plan:   Post-operative Plan: Extubation in OR  Informed Consent: I have reviewed the patients History and Physical, chart, labs and discussed the procedure including the risks, benefits and alternatives for the proposed anesthesia with the patient or authorized representative who has indicated his/her understanding and acceptance.       Plan Discussed with: CRNA and Surgeon  Anesthesia Plan Comments:         Anesthesia Quick Evaluation

## 2019-12-30 NOTE — Discharge Instructions (Addendum)
Antibiotics You may be given a prescription for an antibiotic to take when you arrive home. If so, be sure to take every tablet in the bottle, even if you are feeling better before the prescription is finished. If you begin itching, notice a rash or start to swell on your trunk, arms, legs and/or throat, immediately stop taking the antibiotic and call your Urologist. Diet Resume your usual diet when you return home. To keep your bowels moving easily and softly, drink prune, apple and cranberry juice at room temperature. You may also take a stool softener, such as Colace, which is available without prescription at local pharmacies. Daily activities No driving or heavy lifting for at least two days after the implant. No bike riding, horseback riding or riding lawn mowers for the first month after the implant. Any strenuous physical activity should be approved by your doctor before you resume it. Sexual relations You may resume sexual relations two weeks after the procedure. A condom should be used for the first two weeks. Your semen may be dark brown or black; this is normal and is related bleeding that may have occurred during the implant. Postoperative swelling Expect swelling and bruising of the scrotum and perineum (the area between the scrotum and anus). Both the swelling and the bruising should resolve in l or 2 weeks. Ice packs and over- the-counter medications such as Tylenol, Advil or Aleve may lessen your discomfort. Postoperative urination Most men experience burning on urination and/or urinary frequency. If this becomes bothersome, contact your Urologist.  Medication can be prescribed to relieve these problems.  It is normal to have some blood in your urine for a few days after the implant. Special instructions related to the seeds It is unlikely that you will pass an Iodine-125 seed in your urine. The seeds are silver in color and are about as large as a grain of rice. If you pass a seed,  do not handle it with your fingers. Use a spoon to place it in an envelope or jar in place this in base occluded area such as the garage or basement for return to the radiation clinic at your convenience.  Contact your doctor for Temperature greater than 101 F Increasing pain Inability to urinate Follow-up  You should have follow up with your urologist and radiation oncologist about 3 weeks after the procedure. General information regarding Iodine seeds Iodine-125 is a low energy radioactive material. It is not deeply penetrating and loses energy at short distances. Your prostate will absorb the radiation. Objects that are touched or used by the patient do not become radioactive. Body wastes (urine and stool) or body fluids (saliva, tears, semen or blood) are not radioactive. The Nuclear Regulatory Commission (NRC) has determined that no radiation precautions are needed for patients undergoing Iodine-125 seed implantation. The NRC states that such patients do not present a risk to the people around them, including young children and pregnant women. However, in keeping with the general principle that radiation exposure should be kept as low reasonably possible, we suggest the following: Children and pets should not sit on the patient's lap for the first two (2) weeks after the implant. Pregnant (or possibly pregnant) women should avoid prolonged, close contact with the patient for the first two (2) weeks after the implant. A distance of three (3) feet is acceptable. At a distance of three (3) feet, there is no limit to the length of time anyone can be with the patient.   Post Anesthesia   Home Care Instructions  Activity: Get plenty of rest for the remainder of the day. A responsible individual must stay with you for 24 hours following the procedure.  For the next 24 hours, DO NOT: -Drive a car -Operate machinery -Drink alcoholic beverages -Take any medication unless instructed by your  physician -Make any legal decisions or sign important papers.  Meals: Start with liquid foods such as gelatin or soup. Progress to regular foods as tolerated. Avoid greasy, spicy, heavy foods. If nausea and/or vomiting occur, drink only clear liquids until the nausea and/or vomiting subsides. Call your physician if vomiting continues.  Special Instructions/Symptoms: Your throat may feel dry or sore from the anesthesia or the breathing tube placed in your throat during surgery. If this causes discomfort, gargle with warm salt water. The discomfort should disappear within 24 hours.      

## 2019-12-30 NOTE — H&P (Signed)
CC/HPI: Pt presents today for pre-operative history and physical exam in anticipation of radioactive seed and space oar placement by Dr. Gloriann Loan on 12/30/19. Pt is doing well and his only complaint is of hot flashes. Pt denies F/C, HA, CP, SOB, N/V, diarrhea/constipation, back pain, flank pain, hematuria, and dysuria.    HX:   CC: Prostate cancer  HPI:  69 year old male with PSA values as follows  03/30/2015:0.85  04/05/2016:1.71  05/01/2017:2.22  05/21/2018: 3  07/12/2019: 5.12   Patient does have a family history of prostate cancer. He denies significant voiding complaints at set for postvoid dribbling. He is a healthy male. He denies urinary tract infections or prostatitis. He denies hematuria or dysuria.   He does have a history of nephrolithiasis. He was required ureteroscopy in the past. Last stone was around the year 2000. He has had no problems since then. He has had about 8 stones over the course of his lifetime.   09/10/2019  Patient is status post prostate biopsy. PSA 5.12. Prostate is 20 g by ultrasound. BMI 31. He has had an umbilical hernia repair and right inguinal hernia repair. He is unsure if he had mesh but palpably it feels like he has umbilical hernia mesh. He does have erectile dysfunction and uses sildenafil as needed in this works intermittently for him. He denies significant lower urinary tract symptoms.   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   10/08/2019  In the interval, the patient met with Dr. Tammi Klippel. He elected to proceed with ADT, brachytherapy boost, external radiation. He is not ready to start ADT today. He wants to hold off for 1 week. He did undergo a bone scan that showed uptake in bilateral ribs. However, rib x-rays were normal. CT scan showed no evidence of metastasis.     ALLERGIES: None   MEDICATIONS: Aspirin  Avodart  Lipitor 40 mg tablet  Prilosec  Epinephrine  Fish Oil  Flonase Allergy  Relief  Glucosamine & Chondroitin  Multivitamin  Niacin  Sildenafil Citrate 20 mg tablet  Verapamil Er 240 mg capsule, extended release pellets 24 hr     GU PSH: Locm 300-399Mg/Ml Iodine,1Ml - 09/24/2019 Prostate Needle Biopsy - 09/03/2019     NON-GU PSH: Appendectomy (laparoscopic) Carotid Endarterectomy Hernia Repair Neck Surgery Remove Kidney Stone Surgical Pathology, Gross And Microscopic Examination For Prostate Needle - 09/03/2019     GU PMH: ED due to arterial insufficiency - 09/10/2019 Prostate Cancer - 09/10/2019 Elevated PSA - 07/21/2019 Encounter for Prostate Cancer screening - 07/21/2019 History of urolithiasis - 07/21/2019 Post-void dribbling - 07/21/2019 Renal calculus    NON-GU PMH: Flushing - 11/27/2019 GERD Hypercholesterolemia Hypertension Polyp of colon Sleep Apnea    FAMILY HISTORY: 3 Son's - Other Diabetes - Mother Hypertension - Mother, Sister Prostate Cancer - Father   SOCIAL HISTORY: Marital Status: Married Preferred Language: English; Race: White Current Smoking Status: Patient has never smoked.   Tobacco Use Assessment Completed: Used Tobacco in last 30 days? Does not use smokeless tobacco. Has never drank.  Does not use drugs. Drinks 1 caffeinated drink per day. Has not had a blood transfusion.    REVIEW OF SYSTEMS:    GU Review Male:   Patient reports frequent urination, get up at night to urinate, and stream starts and stops. Patient denies hard to postpone urination, burning/ pain with urination, leakage of urine, trouble starting your stream, have to strain to urinate , erection problems, and penile pain.  Gastrointestinal (Upper):   Patient denies nausea, vomiting, and indigestion/ heartburn.  Gastrointestinal (Lower):   Patient denies diarrhea and constipation.  Constitutional:   Patient reports night sweats. Patient denies fever, weight loss, and fatigue.  Skin:   Patient denies skin rash/ lesion and itching.  Eyes:   Patient  denies blurred vision and double vision.  Ears/ Nose/ Throat:   Patient denies sinus problems and sore throat.  Hematologic/Lymphatic:   Patient reports easy bruising. Patient denies swollen glands.  Cardiovascular:   Patient reports leg swelling. Patient denies chest pains.  Respiratory:   Patient denies cough and shortness of breath.  Endocrine:   Patient denies excessive thirst.  Musculoskeletal:   Patient denies back pain and joint pain.  Neurological:   Patient denies headaches and dizziness.  Psychologic:   Patient denies depression and anxiety.   VITAL SIGNS:      12/08/2019 02:05 PM  Weight 220 lb / 99.79 kg  Height 70 in / 177.8 cm  BP 130/85 mmHg  Heart Rate 91 /min  Temperature 98.5 F / 36.9 C  BMI 31.6 kg/m   MULTI-SYSTEM PHYSICAL EXAMINATION:    Constitutional: Well-nourished. No physical deformities. Normally developed. Good grooming.  Neck: Neck symmetrical, not swollen. Normal tracheal position.  Respiratory: Normal breath sounds. No labored breathing, no use of accessory muscles.   Cardiovascular: Regular rate and rhythm. No murmur, no gallop.   Lymphatic: No enlargement of neck, axillae, groin.  Skin: No paleness, no jaundice, no cyanosis. No lesion, no ulcer, no rash.  Neurologic / Psychiatric: Oriented to time, oriented to place, oriented to person. No depression, no anxiety, no agitation.  Gastrointestinal: No mass, no tenderness, no rigidity, non obese abdomen.  Eyes: Normal conjunctivae. Normal eyelids.  Ears, Nose, Mouth, and Throat: Left ear no scars, no lesions, no masses. Right ear no scars, no lesions, no masses. Nose no scars, no lesions, no masses. Normal hearing. Normal lips.  Musculoskeletal: Normal gait and station of head and neck.     Complexity of Data:  Records Review:   Previous Patient Records  Urine Test Review:   Urinalysis   12/08/19  Urinalysis  Urine Appearance CLEAR   Urine Color YELLOW   Urine Glucose NEGATIVE   Urine Bilirubin  NEGATIVE   Urine Ketones NEGATIVE   Urine Specific Gravity 1.010   Urine Blood NEGATIVE   Urine pH 6.0   Urine Protein NEGATIVE   Urine Urobilinogen 0.2SE.U./dL mg/dL  Urine Nitrites NEGATIVE   Urine Leukocyte Esterase TRACE   Urine WBC/hpf NS (Not Seen)   Urine RBC/hpf NS (Not Seen)   Urine Epithelial Cells NS (Not Seen)   Urine Bacteria NS (Not Seen)   Urine Mucous Not Present   Urine Yeast NS (Not Seen)   Urine Trichomonas Not Present   Urine Cystals NS (Not Seen)   Urine Casts NS (Not Seen)   Urine Sperm Not Present    PROCEDURES:          Urinalysis w/Scope - 81001 Micro  WBC/hpf: NS (Not Seen)  RBC/hpf: NS (Not Seen)  Bacteria: NS (Not Seen)  Cystals: NS (Not Seen)  Casts: NS (Not Seen)  Trichomonas: Not Present  Mucous: Not Present  Epithelial Cells: NS (Not Seen)  Yeast: NS (Not Seen)  Sperm: Not Present    ASSESSMENT:      ICD-10 Details  1 GU:   Prostate Cancer - C61    PLAN:           Schedule  Return Visit/Planned Activity: Keep Scheduled Appointment - Schedule Surgery          Document Letter(s):  Created for Patient: Clinical Summary         Notes:   There are no changes in the patients history or physical exam since last evaluation by Dr. Gloriann Loan. Pt is scheduled to undergo radioactive seed and space oar placement on 12/30/19.   All pt's questions were answered to the best of my ability.          Next Appointment:      Next Appointment: 12/30/2019 01:00 PM    Appointment Type: Surgery     Location: [facility] [phone] [id number]    Provider: Link Snuffer, III, M.D.    Reason for Visit: OP NE SEEDS SPACE OAR      Signed by Mcarthur Rossetti, PA on 12/08/19 at 3:18 PM (EDT

## 2019-12-30 NOTE — Anesthesia Procedure Notes (Signed)
Procedure Name: LMA Insertion Date/Time: 12/30/2019 1:04 PM Performed by: Mechele Claude, CRNA Pre-anesthesia Checklist: Patient identified, Emergency Drugs available, Suction available and Patient being monitored Patient Re-evaluated:Patient Re-evaluated prior to induction Oxygen Delivery Method: Circle system utilized Preoxygenation: Pre-oxygenation with 100% oxygen Induction Type: IV induction Ventilation: Mask ventilation without difficulty LMA: LMA inserted LMA Size: 5.0 Number of attempts: 1 Airway Equipment and Method: Bite block Placement Confirmation: positive ETCO2 Tube secured with: Tape Dental Injury: Teeth and Oropharynx as per pre-operative assessment

## 2019-12-30 NOTE — Op Note (Signed)
PATIENT:  Kyle English  PRE-OPERATIVE DIAGNOSIS:  Adenocarcinoma of the prostate  POST-OPERATIVE DIAGNOSIS:  Same  PROCEDURE:  1. I-125 radioactive seed implantation 2. Cystoscopy  3. Placement of SpaceOAR  SURGEON:  Surgeon(s): Wendie Simmer, MD  Radiation oncologist: Dr. Tyler Pita  ANESTHESIA:  General  EBL:  Minimal  DRAINS: 69 French Foley catheter  INDICATION: Kyle English  Description of procedure: After informed consent the patient was brought to the major OR, placed on the table and administered general anesthesia. He was then moved to the modified lithotomy position with his perineum perpendicular to the floor. His perineum and genitalia were then sterilely prepped. An official timeout was then performed. A 16 French Foley catheter was then placed in the bladder and filled with dilute contrast, a rectal tube was placed in the rectum and the transrectal ultrasound probe was placed in the rectum and affixed to the stand. He was then sterilely draped.  Real time ultrasonography was used along with the seed planning software Oncentra Prostate. This was used to develop the seed plan including the number of needles as well as number of seeds required for complete and adequate coverage. Real-time ultrasonography was then used along with the previously developed plan and the Nucletron device to implant a total of 63 seeds using 20 needles. This proceeded without difficulty or complication.   I then proceeded with placement of SpaceOARby introducing a needle with the bevel angled inferiorly approximately 2 cm superior to the anus. This was angled downward and under direct ultrasound was placed within the space between the prostatic capsule and rectum. This was confirmed with a small amount of sterile saline injected and this was performed under direct ultrasound. I then attached the SpaceOARto the needle and injected this in the space between the prostate and rectum  with good placement noted.  A Foley catheter was then removed as well as the transrectal ultrasound probe and rectal probe. Flexible cystoscopy was then performed using the 17 French flexible scope which revealed a normal urethra throughout its length down to the sphincter which appeared intact. The prostatic urethra revealed bilobar hypertrophy but no evidence of obstruction, seeds, spacers or lesions. The bladder was then entered and fully and systematically inspected. The ureteral orifices were noted to be of normal configuration and position. The mucosa revealed no evidence of tumors. There were also no stones identified within the bladder. I noted no seeds or spacers on the floor of the bladder and retroflexion of the scope revealed no seeds protruding from the base of the prostate.  The cystoscope was then removed and the patient was awakened and taken to recovery room in stable and satisfactory condition. He tolerated procedure well and there were no intraoperative complications.

## 2019-12-30 NOTE — Anesthesia Postprocedure Evaluation (Signed)
Anesthesia Post Note  Patient: Ado Gorelik [facility]  Procedure(s) Performed: RADIOACTIVE SEED IMPLANT/BRACHYTHERAPY IMPLANT (N/A Prostate) SPACE OAR INSTILLATION (N/A Perineum)     Patient location during evaluation: PACU Anesthesia Type: General Level of consciousness: awake and alert Pain management: pain level controlled Vital Signs Assessment: post-procedure vital signs reviewed and stable Respiratory status: spontaneous breathing, nonlabored ventilation, respiratory function stable and patient connected to nasal cannula oxygen Cardiovascular status: blood pressure returned to baseline and stable Postop Assessment: no apparent nausea or vomiting Anesthetic complications: no    Last Vitals:  Vitals:   12/30/19 1148 12/30/19 1411  BP: 139/84 (P) 140/89  Pulse: 87 76  Resp: 16 11  Temp: 36.8 C (P) 36.4 C  SpO2: 98% 93%    Last Pain:  Vitals:   12/30/19 1411  TempSrc:   PainSc: (P) Asleep                 Maricsa Sammons DAVID

## 2020-01-03 NOTE — Progress Notes (Signed)
  Radiation Oncology         (336) [phone] ________________________________  Name: DOY TAAFFE MRN: [id number]  Date: 01/03/2020  DOB: [birth year]       Prostate Seed Implant  CC:London Pepper, MD  No ref. provider found  DIAGNOSIS:  Oncology History  Malignant neoplasm of prostate ([place])  09/03/2019 Cancer Staging   Staging form: Prostate, AJCC 8th Edition - Clinical stage from 09/03/2019: Stage IIC (cT1c, cN0, cM0, PSA: 5.1, Grade Group: 4) - Signed by Freeman Caldron, PA-C on 09/29/2019   09/29/2019 Initial Diagnosis   Malignant neoplasm of prostate ([place])     No diagnosis found.  PROCEDURE: Insertion of radioactive I-125 seeds into the prostate gland.  RADIATION DOSE: 110 Gy, boost therapy.  TECHNIQUE: OWYN RAULSTON was brought to the operating room with the urologist. He was placed in the dorsolithotomy position. He was catheterized and a rectal tube was inserted. The perineum was shaved, prepped and draped. The ultrasound probe was then introduced into the rectum to see the prostate gland.  TREATMENT DEVICE: A needle grid was attached to the ultrasound probe stand and anchor needles were placed.  3D PLANNING: The prostate was imaged in 3D using a sagittal sweep of the prostate probe. These images were transferred to the planning computer. There, the prostate, urethra and rectum were defined on each axial reconstructed image. Then, the software created an optimized 3D plan and a few seed positions were adjusted. The quality of the plan was reviewed using [facility] information for the target and the following two organs at risk:  Urethra and Rectum.  Then the accepted plan was printed and handed off to the radiation therapist.  Under my supervision, the custom loading of the seeds and spacers was carried out and loaded into sealed vicryl sleeves.  These pre-loaded needles were then placed into the needle holder.[name] Kitchen  PROSTATE VOLUME STUDY:  Using transrectal ultrasound the volume of the  prostate was verified to be 19.3 cc.  SPECIAL TREATMENT PROCEDURE/SUPERVISION AND HANDLING: The pre-loaded needles were then delivered under sagittal guidance. A total of 20 needles were used to deposit 63 seeds in the prostate gland. The individual seed activity was 0.219 mCi.  SpaceOAR:  Yes, Vue  COMPLEX SIMULATION: At the end of the procedure, an anterior radiograph of the pelvis was obtained to document seed positioning and count. Cystoscopy was performed to check the urethra and bladder.  MICRODOSIMETRY: At the end of the procedure, the patient was emitting 0.044 mR/hr at 1 meter. Accordingly, he was considered safe for hospital discharge.  PLAN: The patient will return to the radiation oncology clinic for post implant CT dosimetry in three weeks.   ________________________________  Sheral Apley Tammi Klippel, M.D.

## 2020-01-12 ENCOUNTER — Ambulatory Visit (HOSPITAL_COMMUNITY): Payer: PPO

## 2020-01-12 ENCOUNTER — Other Ambulatory Visit: Payer: Self-pay

## 2020-01-12 NOTE — Progress Notes (Signed)
Patient is seen for prostate cancer. Pt states nocturia 2-3 times per night. Pt denies having dysuria or hematuria. Pt states that he is having regular bowel movements. Pt states urine stream is moderate to steady. Pt denies having hesitancy or straining. Pt denies having urgency. Pt denies leakage of urine. Pt states emptying his bladder about have the time.

## 2020-01-13 ENCOUNTER — Telehealth: Payer: Self-pay | Admitting: *Deleted

## 2020-01-13 DIAGNOSIS — E782 Mixed hyperlipidemia: Secondary | ICD-10-CM | POA: Diagnosis not present

## 2020-01-13 NOTE — Telephone Encounter (Signed)
Called patient to remind of post seed appts. for 01-14-20, lvm for a return call

## 2020-01-14 ENCOUNTER — Ambulatory Visit
Admission: RE | Admit: 2020-01-14 | Discharge: 2020-01-14 | Disposition: A | Discharge status: Home / Self Care | Payer: PPO | Source: Ambulatory Visit | Attending: Urology | Admitting: Urology

## 2020-01-14 ENCOUNTER — Encounter: Payer: Self-pay | Admitting: Medical Oncology

## 2020-01-14 ENCOUNTER — Ambulatory Visit
Admission: RE | Admit: 2020-01-14 | Discharge: 2020-01-14 | Disposition: A | Discharge status: Home / Self Care | Payer: PPO | Source: Ambulatory Visit | Attending: Radiation Oncology | Admitting: Radiation Oncology

## 2020-01-14 ENCOUNTER — Other Ambulatory Visit: Payer: Self-pay

## 2020-01-14 ENCOUNTER — Ambulatory Visit: Payer: PPO | Admitting: Radiation Oncology

## 2020-01-14 DIAGNOSIS — C61 Malignant neoplasm of prostate: Secondary | ICD-10-CM

## 2020-01-18 NOTE — Progress Notes (Signed)
  Radiation Oncology         (336) [phone] ________________________________  Name: Kyle English MRN: [id number]  Date: 01/14/2020  DOB: 1951-04-28  COMPLEX SIMULATION NOTE  NARRATIVE:  The patient was brought to the [facility] today following prostate seed implantation approximately one month ago.  Identity was confirmed.  All relevant records and images related to the planned course of therapy were reviewed.  Then, the patient was set-up supine.  CT images were obtained.  The CT images were loaded into the planning software.  Then the prostate and rectum were contoured.  Treatment planning then occurred.  The implanted iodine 125 seeds were identified by the physics staff for projection of radiation distribution  I have requested : 3D Simulation  I have requested a DVH of the following structures: Prostate and rectum.    ________________________________  Sheral Apley Tammi Klippel, M.D.

## 2020-01-18 NOTE — Progress Notes (Signed)
  Radiation Oncology         (336) [phone] ________________________________  Name: Kyle English MRN: [id number]  Date: 01/14/2020  DOB: 1951/02/25  SIMULATION AND TREATMENT PLANNING NOTE    ICD-10-CM   1. Malignant neoplasm of prostate ([place])  C61     DIAGNOSIS:  69 y.o. gentleman with Stage T1c adenocarcinoma of the prostate with Gleason score of 4+4, and PSA of 5.12  NARRATIVE:  The patient was brought to the [facility].  Identity was confirmed.  All relevant records and images related to the planned course of therapy were reviewed.  The patient freely provided informed written consent to proceed with treatment after reviewing the details related to the planned course of therapy. The consent form was witnessed and verified by the simulation staff.  Then, the patient was set-up in a stable reproducible supine position for radiation therapy.  A vacuum lock pillow device was custom fabricated to position his legs in a reproducible immobilized position.  Then, I performed a urethrogram under sterile conditions to identify the prostatic apex.  CT images were obtained.  Surface markings were placed.  The CT images were loaded into the planning software.  Then the prostate target and avoidance structures including the rectum, bladder, bowel and hips were contoured.  Treatment planning then occurred.  The radiation prescription was entered and confirmed.  A total of one complex treatment devices were fabricated. I have requested : Intensity Modulated Radiotherapy (IMRT) is medically necessary for this case for the following reason:  Rectal sparing.[name] Kitchen  PLAN:  The patient will receive 45 Gy in 25 fractions of 1.8 Gy, to supplement an up-front prostate seed implant boost of 110 Gy to achieve a total nominal dose of 165 Gy.  ________________________________  Sheral Apley Tammi Klippel, M.D.

## 2020-01-19 DIAGNOSIS — C61 Malignant neoplasm of prostate: Secondary | ICD-10-CM | POA: Diagnosis not present

## 2020-01-20 DIAGNOSIS — C61 Malignant neoplasm of prostate: Secondary | ICD-10-CM | POA: Diagnosis not present

## 2020-01-21 ENCOUNTER — Encounter: Payer: Self-pay | Admitting: Radiation Oncology

## 2020-01-21 ENCOUNTER — Other Ambulatory Visit (HOSPITAL_COMMUNITY): Payer: PPO

## 2020-01-21 DIAGNOSIS — C61 Malignant neoplasm of prostate: Secondary | ICD-10-CM | POA: Diagnosis not present

## 2020-01-25 ENCOUNTER — Ambulatory Visit
Admission: RE | Admit: 2020-01-25 | Discharge: 2020-01-25 | Disposition: A | Discharge status: Home / Self Care | Payer: PPO | Source: Ambulatory Visit | Attending: Radiation Oncology | Admitting: Radiation Oncology

## 2020-01-25 ENCOUNTER — Other Ambulatory Visit: Payer: Self-pay

## 2020-01-25 DIAGNOSIS — C61 Malignant neoplasm of prostate: Secondary | ICD-10-CM | POA: Diagnosis not present

## 2020-01-26 ENCOUNTER — Ambulatory Visit
Admission: RE | Admit: 2020-01-26 | Discharge: 2020-01-26 | Disposition: A | Discharge status: Home / Self Care | Payer: PPO | Source: Ambulatory Visit | Attending: Radiation Oncology | Admitting: Radiation Oncology

## 2020-01-26 ENCOUNTER — Other Ambulatory Visit: Payer: Self-pay

## 2020-01-26 DIAGNOSIS — C61 Malignant neoplasm of prostate: Secondary | ICD-10-CM | POA: Diagnosis not present

## 2020-01-27 ENCOUNTER — Ambulatory Visit
Admission: RE | Admit: 2020-01-27 | Discharge: 2020-01-27 | Disposition: A | Discharge status: Home / Self Care | Payer: PPO | Source: Ambulatory Visit | Attending: Radiation Oncology | Admitting: Radiation Oncology

## 2020-01-27 ENCOUNTER — Other Ambulatory Visit: Payer: Self-pay

## 2020-01-27 DIAGNOSIS — C61 Malignant neoplasm of prostate: Secondary | ICD-10-CM | POA: Diagnosis not present

## 2020-01-28 ENCOUNTER — Ambulatory Visit
Admission: RE | Admit: 2020-01-28 | Discharge: 2020-01-28 | Disposition: A | Discharge status: Home / Self Care | Payer: PPO | Source: Ambulatory Visit | Attending: Radiation Oncology | Admitting: Radiation Oncology

## 2020-01-28 ENCOUNTER — Other Ambulatory Visit: Payer: Self-pay

## 2020-01-28 DIAGNOSIS — C61 Malignant neoplasm of prostate: Secondary | ICD-10-CM | POA: Insufficient documentation

## 2020-01-29 ENCOUNTER — Ambulatory Visit
Admission: RE | Admit: 2020-01-29 | Discharge: 2020-01-29 | Disposition: A | Discharge status: Home / Self Care | Payer: PPO | Source: Ambulatory Visit | Attending: Radiation Oncology | Admitting: Radiation Oncology

## 2020-01-29 ENCOUNTER — Other Ambulatory Visit: Payer: Self-pay

## 2020-01-29 DIAGNOSIS — C61 Malignant neoplasm of prostate: Secondary | ICD-10-CM | POA: Diagnosis not present

## 2020-02-02 ENCOUNTER — Ambulatory Visit
Admission: RE | Admit: 2020-02-02 | Discharge: 2020-02-02 | Disposition: A | Discharge status: Home / Self Care | Payer: PPO | Source: Ambulatory Visit | Attending: Radiation Oncology | Admitting: Radiation Oncology

## 2020-02-02 DIAGNOSIS — C61 Malignant neoplasm of prostate: Secondary | ICD-10-CM | POA: Diagnosis not present

## 2020-02-03 ENCOUNTER — Ambulatory Visit
Admission: RE | Admit: 2020-02-03 | Discharge: 2020-02-03 | Disposition: A | Discharge status: Home / Self Care | Payer: PPO | Source: Ambulatory Visit | Attending: Radiation Oncology | Admitting: Radiation Oncology

## 2020-02-03 DIAGNOSIS — C61 Malignant neoplasm of prostate: Secondary | ICD-10-CM | POA: Diagnosis not present

## 2020-02-04 ENCOUNTER — Ambulatory Visit
Admission: RE | Admit: 2020-02-04 | Discharge: 2020-02-04 | Disposition: A | Discharge status: Home / Self Care | Payer: PPO | Source: Ambulatory Visit | Attending: Radiation Oncology | Admitting: Radiation Oncology

## 2020-02-04 ENCOUNTER — Other Ambulatory Visit: Payer: Self-pay

## 2020-02-04 DIAGNOSIS — C61 Malignant neoplasm of prostate: Secondary | ICD-10-CM | POA: Diagnosis not present

## 2020-02-05 ENCOUNTER — Ambulatory Visit
Admission: RE | Admit: 2020-02-05 | Discharge: 2020-02-05 | Disposition: A | Discharge status: Home / Self Care | Payer: PPO | Source: Ambulatory Visit | Attending: Radiation Oncology | Admitting: Radiation Oncology

## 2020-02-05 DIAGNOSIS — C61 Malignant neoplasm of prostate: Secondary | ICD-10-CM | POA: Diagnosis not present

## 2020-02-08 ENCOUNTER — Other Ambulatory Visit: Payer: Self-pay

## 2020-02-08 ENCOUNTER — Ambulatory Visit
Admission: RE | Admit: 2020-02-08 | Discharge: 2020-02-08 | Disposition: A | Discharge status: Home / Self Care | Payer: PPO | Source: Ambulatory Visit | Attending: Radiation Oncology | Admitting: Radiation Oncology

## 2020-02-08 DIAGNOSIS — C61 Malignant neoplasm of prostate: Secondary | ICD-10-CM | POA: Diagnosis not present

## 2020-02-09 ENCOUNTER — Other Ambulatory Visit: Payer: Self-pay

## 2020-02-09 ENCOUNTER — Ambulatory Visit
Admission: RE | Admit: 2020-02-09 | Discharge: 2020-02-09 | Disposition: A | Discharge status: Home / Self Care | Payer: PPO | Source: Ambulatory Visit | Attending: Radiation Oncology | Admitting: Radiation Oncology

## 2020-02-09 DIAGNOSIS — C61 Malignant neoplasm of prostate: Secondary | ICD-10-CM | POA: Diagnosis not present

## 2020-02-10 ENCOUNTER — Other Ambulatory Visit: Payer: Self-pay

## 2020-02-10 ENCOUNTER — Ambulatory Visit
Admission: RE | Admit: 2020-02-10 | Discharge: 2020-02-10 | Disposition: A | Discharge status: Home / Self Care | Payer: PPO | Source: Ambulatory Visit | Attending: Radiation Oncology | Admitting: Radiation Oncology

## 2020-02-10 DIAGNOSIS — C61 Malignant neoplasm of prostate: Secondary | ICD-10-CM | POA: Diagnosis not present

## 2020-02-11 ENCOUNTER — Ambulatory Visit
Admission: RE | Admit: 2020-02-11 | Discharge: 2020-02-11 | Disposition: A | Discharge status: Home / Self Care | Payer: PPO | Source: Ambulatory Visit | Attending: Radiation Oncology | Admitting: Radiation Oncology

## 2020-02-11 ENCOUNTER — Other Ambulatory Visit: Payer: Self-pay

## 2020-02-11 DIAGNOSIS — C61 Malignant neoplasm of prostate: Secondary | ICD-10-CM | POA: Diagnosis not present

## 2020-02-12 ENCOUNTER — Other Ambulatory Visit: Payer: Self-pay

## 2020-02-12 ENCOUNTER — Ambulatory Visit
Admission: RE | Admit: 2020-02-12 | Discharge: 2020-02-12 | Disposition: A | Discharge status: Home / Self Care | Payer: PPO | Source: Ambulatory Visit | Attending: Radiation Oncology | Admitting: Radiation Oncology

## 2020-02-12 DIAGNOSIS — C61 Malignant neoplasm of prostate: Secondary | ICD-10-CM | POA: Diagnosis not present

## 2020-02-15 ENCOUNTER — Ambulatory Visit
Admission: RE | Admit: 2020-02-15 | Discharge: 2020-02-15 | Disposition: A | Discharge status: Home / Self Care | Payer: PPO | Source: Ambulatory Visit | Attending: Radiation Oncology | Admitting: Radiation Oncology

## 2020-02-15 ENCOUNTER — Other Ambulatory Visit: Payer: Self-pay

## 2020-02-15 DIAGNOSIS — C61 Malignant neoplasm of prostate: Secondary | ICD-10-CM | POA: Diagnosis not present

## 2020-02-16 ENCOUNTER — Ambulatory Visit
Admission: RE | Admit: 2020-02-16 | Discharge: 2020-02-16 | Disposition: A | Discharge status: Home / Self Care | Payer: PPO | Source: Ambulatory Visit | Attending: Radiation Oncology | Admitting: Radiation Oncology

## 2020-02-16 ENCOUNTER — Other Ambulatory Visit: Payer: Self-pay

## 2020-02-16 DIAGNOSIS — C61 Malignant neoplasm of prostate: Secondary | ICD-10-CM | POA: Diagnosis not present

## 2020-02-17 ENCOUNTER — Ambulatory Visit
Admission: RE | Admit: 2020-02-17 | Discharge: 2020-02-17 | Disposition: A | Discharge status: Home / Self Care | Payer: PPO | Source: Ambulatory Visit | Attending: Radiation Oncology | Admitting: Radiation Oncology

## 2020-02-17 ENCOUNTER — Other Ambulatory Visit: Payer: Self-pay

## 2020-02-17 DIAGNOSIS — C61 Malignant neoplasm of prostate: Secondary | ICD-10-CM | POA: Diagnosis not present

## 2020-02-18 ENCOUNTER — Ambulatory Visit
Admission: RE | Admit: 2020-02-18 | Discharge: 2020-02-18 | Disposition: A | Discharge status: Home / Self Care | Payer: PPO | Source: Ambulatory Visit | Attending: Radiation Oncology | Admitting: Radiation Oncology

## 2020-02-18 ENCOUNTER — Other Ambulatory Visit: Payer: Self-pay

## 2020-02-18 DIAGNOSIS — C61 Malignant neoplasm of prostate: Secondary | ICD-10-CM | POA: Diagnosis not present

## 2020-02-19 ENCOUNTER — Other Ambulatory Visit: Payer: Self-pay

## 2020-02-19 ENCOUNTER — Ambulatory Visit
Admission: RE | Admit: 2020-02-19 | Discharge: 2020-02-19 | Disposition: A | Discharge status: Home / Self Care | Payer: PPO | Source: Ambulatory Visit | Attending: Radiation Oncology | Admitting: Radiation Oncology

## 2020-02-19 DIAGNOSIS — C61 Malignant neoplasm of prostate: Secondary | ICD-10-CM | POA: Diagnosis not present

## 2020-02-22 ENCOUNTER — Other Ambulatory Visit: Payer: Self-pay

## 2020-02-22 ENCOUNTER — Ambulatory Visit
Admission: RE | Admit: 2020-02-22 | Discharge: 2020-02-22 | Disposition: A | Discharge status: Home / Self Care | Payer: PPO | Source: Ambulatory Visit | Attending: Radiation Oncology | Admitting: Radiation Oncology

## 2020-02-22 DIAGNOSIS — C61 Malignant neoplasm of prostate: Secondary | ICD-10-CM | POA: Diagnosis not present

## 2020-02-22 NOTE — Progress Notes (Signed)
  Radiation Oncology         [phone]) [phone] ________________________________  Name: Kyle English MRN: [id number]  Date: 01/21/2020  DOB: 06/17/1951  3D Planning Note   Prostate Brachytherapy Post-Implant Dosimetry  Diagnosis:  69 y.o. gentleman with Stage T1c adenocarcinoma of the prostate with Gleason score of 4+4, and PSA of 5.12.  Narrative: On a previous date, Kyle English returned following prostate seed implantation for post implant planning. He underwent CT scan complex simulation to delineate the three-dimensional structures of the pelvis and demonstrate the radiation distribution.  Since that time, the seed localization, and complex isodose planning with dose volume histograms have now been completed.  Results:   Prostate Coverage - The dose of radiation delivered to the 90% or more of the prostate gland (D90) was 121.3% of the prescription dose. This exceeds our goal of greater than 90%. Rectal Sparing - The volume of rectal tissue receiving the prescription dose or higher was 0.0 cc. This falls under our thresholds tolerance of 1.0 cc.  Impression: The prostate seed implant appears to show adequate target coverage and appropriate rectal sparing.  Plan:  The patient will continue to follow with urology for ongoing PSA determinations. I would anticipate a high likelihood for local tumor control with minimal risk for rectal morbidity.  ________________________________  Sheral Apley Tammi Klippel, M.D.

## 2020-02-23 ENCOUNTER — Ambulatory Visit
Admission: RE | Admit: 2020-02-23 | Discharge: 2020-02-23 | Disposition: A | Discharge status: Home / Self Care | Payer: PPO | Source: Ambulatory Visit | Attending: Radiation Oncology | Admitting: Radiation Oncology

## 2020-02-23 ENCOUNTER — Other Ambulatory Visit: Payer: Self-pay

## 2020-02-23 DIAGNOSIS — G4733 Obstructive sleep apnea (adult) (pediatric): Secondary | ICD-10-CM | POA: Diagnosis not present

## 2020-02-23 DIAGNOSIS — C61 Malignant neoplasm of prostate: Secondary | ICD-10-CM | POA: Diagnosis not present

## 2020-02-24 ENCOUNTER — Ambulatory Visit
Admission: RE | Admit: 2020-02-24 | Discharge: 2020-02-24 | Disposition: A | Discharge status: Home / Self Care | Payer: PPO | Source: Ambulatory Visit | Attending: Radiation Oncology | Admitting: Radiation Oncology

## 2020-02-24 ENCOUNTER — Other Ambulatory Visit: Payer: Self-pay

## 2020-02-24 DIAGNOSIS — C61 Malignant neoplasm of prostate: Secondary | ICD-10-CM | POA: Diagnosis not present

## 2020-02-25 ENCOUNTER — Ambulatory Visit
Admission: RE | Admit: 2020-02-25 | Discharge: 2020-02-25 | Disposition: A | Discharge status: Home / Self Care | Payer: PPO | Source: Ambulatory Visit | Attending: Radiation Oncology | Admitting: Radiation Oncology

## 2020-02-25 DIAGNOSIS — C61 Malignant neoplasm of prostate: Secondary | ICD-10-CM | POA: Diagnosis not present

## 2020-02-26 ENCOUNTER — Ambulatory Visit
Admission: RE | Admit: 2020-02-26 | Discharge: 2020-02-26 | Disposition: A | Discharge status: Home / Self Care | Payer: PPO | Source: Ambulatory Visit | Attending: Radiation Oncology | Admitting: Radiation Oncology

## 2020-02-26 ENCOUNTER — Other Ambulatory Visit: Payer: Self-pay

## 2020-02-26 DIAGNOSIS — Z5111 Encounter for antineoplastic chemotherapy: Secondary | ICD-10-CM | POA: Diagnosis not present

## 2020-02-26 DIAGNOSIS — C61 Malignant neoplasm of prostate: Secondary | ICD-10-CM | POA: Diagnosis not present

## 2020-02-29 ENCOUNTER — Other Ambulatory Visit: Payer: Self-pay

## 2020-02-29 ENCOUNTER — Ambulatory Visit
Admission: RE | Admit: 2020-02-29 | Discharge: 2020-02-29 | Disposition: A | Discharge status: Home / Self Care | Payer: PPO | Source: Ambulatory Visit | Attending: Radiation Oncology | Admitting: Radiation Oncology

## 2020-02-29 ENCOUNTER — Encounter: Payer: Self-pay | Admitting: Medical Oncology

## 2020-02-29 ENCOUNTER — Encounter: Payer: Self-pay | Admitting: Radiation Oncology

## 2020-02-29 DIAGNOSIS — C61 Malignant neoplasm of prostate: Secondary | ICD-10-CM | POA: Diagnosis not present

## 2020-02-29 DIAGNOSIS — E782 Mixed hyperlipidemia: Secondary | ICD-10-CM | POA: Diagnosis not present

## 2020-03-17 ENCOUNTER — Telehealth: Payer: Self-pay | Admitting: Radiation Oncology

## 2020-03-17 NOTE — Telephone Encounter (Signed)
Phoned patient. Explained this RN received a message from Romie Jumper that he would like to get his COVID 19 booster here at [facility]. Patient confirmed. Explained he does qualify for this. Explained this RN has sent a message to scheduling to reach out. Patient verbalized understanding and appreciation for the assistance.

## 2020-03-21 ENCOUNTER — Inpatient Hospital Stay: Payer: PPO | Attending: Urology

## 2020-03-21 ENCOUNTER — Other Ambulatory Visit: Payer: Self-pay

## 2020-03-21 DIAGNOSIS — Z23 Encounter for immunization: Secondary | ICD-10-CM | POA: Insufficient documentation

## 2020-03-21 NOTE — Progress Notes (Signed)
   Covid-19 Vaccination Clinic  Name:  Kyle English    MRN: [id number] DOB: [birth year]  03/21/2020  [name] was observed post Covid-19 immunization for 15 minutes without incident. He was provided with Vaccine Information Sheet and instruction to access the V-Safe system.   [name] was instructed to call 911 with any severe reactions post vaccine: [name] Kitchen Difficulty breathing  . Swelling of face and throat  . A fast heartbeat  . A bad rash all over body  . Dizziness and weakness   Immunizations Administered    Name Date Dose VIS Date Route   Pfizer COVID-19 Vaccine 03/21/2020  2:22 PM 0.3 mL 09/23/2018 Intramuscular   Manufacturer: [place]   Lot: [id number]   [place]: [id number]

## 2020-03-25 DIAGNOSIS — I951 Orthostatic hypotension: Secondary | ICD-10-CM | POA: Diagnosis not present

## 2020-03-25 DIAGNOSIS — R42 Dizziness and giddiness: Secondary | ICD-10-CM | POA: Diagnosis not present

## 2020-03-31 ENCOUNTER — Telehealth: Payer: Self-pay | Admitting: *Deleted

## 2020-03-31 NOTE — Telephone Encounter (Signed)
CALLED PATIENT TO ALTER FU ON 04-06-20 DUE TO CHANGE IN ASHLYN'S SCHEDULE, PATIENT AGREED FOR ASHLYN TO CALL HIM @ 9:30 AM ON 04-06-20

## 2020-04-05 ENCOUNTER — Encounter: Payer: Self-pay | Admitting: Urology

## 2020-04-05 ENCOUNTER — Telehealth: Payer: Self-pay

## 2020-04-05 NOTE — Progress Notes (Signed)
Radiation Oncology         [phone] ________________________________  Name: Kyle English MRN: [id number]  Date: 04/06/2020  DOB: [birth year]  Post Treatment Note  CC: London Pepper, MD  Lucas Mallow, MD  Diagnosis:   69 y.o.gentleman with Stage T1cadenocarcinoma of the prostate with Gleason score of 4+4, and PSA of5.12    Interval Since Last Radiation:  5 weeks, concurrent with ADT [place] 10/16/19) 12/30/2019:  Insertion of radioactive I-125 seeds into the prostate gland;110Gy, boost therapy.  01/25/20 - 02/29/20:  The prostate and pelvic lymph nodes were treated to 45 Gy in 25 fractions of 1.8 Gy, to supplement an up-front prostate seed implant boost of 110 Gy to achieve a total nominal dose of 155 Gy.  Narrative: I spoke with the patient to conduct his routine scheduled 1 month follow up visit via telephone to spare the patient unnecessary potential exposure in the healthcare setting during the current COVID-19 pandemic.  The patient was notified in advance and gave permission to proceed with this visit format.  He tolerated radiation treatment relatively well with only minor urinary irritation and modest fatigue.  He experienced mild dysuria, intermittent stream, hesitancy, feelings of incomplete bladder emptying and frequency but denied gross hematuria, straining or leakage. He denied bowel issues.                              On review of systems, the patient states that he is doing well in general.  He reports a gradual improvement in the LUTS with a current IPSS score of 8, indicating mild urinary symptoms.  He has continued taking Flomax as prescribed and reports that his stream is strong but he continues with nocturia 2 times per night with intermittency and occasional feelings of incomplete bladder emptying.  He specifically denies dysuria, gross hematuria, urgency, hesitancy, straining to void or incontinence.  He denies abdominal pain, nausea, vomiting, diarrhea or  constipation.  He reports a healthy appetite and is maintaining his weight.  He has continued with significant hot flashes and fatigue associated with the ADT.  Overall, he is quite pleased with his progress to date.  ALLERGIES:  has No Known Allergies.  Meds: Current Outpatient Medications  Medication Sig Dispense Refill   aspirin 81 MG tablet take 1 tablet by mouth once daily at bedtime     atorvastatin (LIPITOR) 40 MG tablet Take 40 mg by mouth at bedtime.      dutasteride (AVODART) 0.5 MG capsule Take 0.5 mg by mouth at bedtime.      EPINEPHrine (EPIPEN 2-PAK) 0.3 mg/0.3 mL IJ SOAJ injection Inject 0.3 mg into the muscle as needed.      Fish Oil-Cholecalciferol (FISH OIL + D3) 1200-1000 MG-UNIT CAPS Take 1 capsule by mouth daily.      fluticasone (FLONASE) 50 MCG/ACT nasal spray Place 2 sprays into both nostrils as needed.      Glucos-Chondroit-Hyaluron-MSM (GLUCOSAMINE CHONDROITIN JOINT) TABS Take 1 tablet by mouth in the morning and at bedtime.      Multiple Vitamins-Minerals (MULTI ADULT GUMMIES) CHEW Chew 2 tablets by mouth daily.      niacin 500 MG tablet Take 500 mg by mouth daily. Taking one tablet once in the morning.      omeprazole (PRILOSEC OTC) 20 MG tablet Take 20 mg by mouth daily.      sildenafil (REVATIO) 20 MG tablet Take 20 mg by mouth as needed (  ED).      tamsulosin (FLOMAX) 0.4 MG CAPS capsule Take 0.4 mg by mouth daily.     verapamil (CALAN-SR) 240 MG CR tablet Take 240 mg by mouth daily.      HYDROcodone-acetaminophen (NORCO) 5-325 MG tablet Take 1 tablet by mouth every 4 (four) hours as needed for moderate pain. (Patient not taking: Reported on 04/05/2020) 6 tablet 0   No current facility-administered medications for this encounter.    Physical Findings:  vitals were not taken for this visit.   /Unable to assess due to telephone follow up visit format.  Lab Findings: Lab Results  Component Value Date   WBC 5.6 12/29/2019   HGB 14.8 12/29/2019    HCT 41.6 12/29/2019   MCV 93.7 12/29/2019   PLT 194 12/29/2019     Radiographic Findings: No results found.  Impression/Plan: 1. 69 y.o.gentleman with Stage T1cadenocarcinoma of the prostate with Gleason score of 4+4, and PSA of5.12.   He will continue to follow up with urology for ongoing PSA determinations and has an appointment scheduled for labs on 04/13/2020 followed by a visit with Dr. Gloriann Loan on 04/20/2020 at which time he will be due for his next Eligard ADT injection.  He anticipates completing a full 18 to 9-month course of ADT under the direction of Dr. Gloriann Loan.  He understands what to expect with regards to PSA monitoring going forward. I will look forward to following his response to treatment via correspondence with urology, and would be happy to continue to participate in his care if clinically indicated. I talked to the patient about what to expect in the future, including his risk for erectile dysfunction and rectal bleeding. I encouraged him to call or return to the office if he has any questions regarding his previous radiation or possible radiation side effects. He was comfortable with this plan and will follow up as needed.    Nicholos Johns, PA-C

## 2020-04-05 NOTE — Telephone Encounter (Signed)
Spoke with patient in regards to 1 month follow-up telephone appointment with Freeman Caldron PA on 04/06/20 at 9:30am. Patient verbalized understanding of appointment date and time. Meaningful use, AUA and prostate questions were reviewed.

## 2020-04-05 NOTE — Progress Notes (Signed)
Patient has a telephone follow-up visit with Ashlyn Bruning PA. Patient states nocturia 2 times per night. Patient denies having dysuria or hematuria. Patient states urine stream is strong and intermittent. Patient states that he is not emptying his bladder completely and returning to the bathroom 2 hours later. Patient denies having urgency. Patient denies leakage. Patient denies hesitancy or straining. Patient states that he is taking Flomax as directed. Patient states that he has a follow-up appointment with [facility] on 04/13/20.

## 2020-04-05 NOTE — Progress Notes (Signed)
  Radiation Oncology         [phone] ________________________________  Name: TYREES CHOPIN MRN: [id number]  Date: 02/29/2020  DOB: 1951-04-26  End of Treatment Note  Diagnosis:   69 y.o.gentleman with Stage T1cadenocarcinoma of the prostate with Gleason score of 4+4, and PSA of5.12     Indication for treatment:  Curative, Definitive Radiotherapy       Radiation treatment dates:   12/30/2019; 01/25/20 - 02/29/20  Site/dose:    12/30/2019:  Insertion of radioactive I-125 seeds into the prostate gland; 110 Gy, boost therapy.  01/25/20 - 02/29/20:  The prostate and pelvic lymph nodes were treated to 45 Gy in 25 fractions of 1.8 Gy, to supplement an up-front prostate seed implant boost of 110 Gy to achieve a total nominal dose of 155 Gy.  Beams/energy:    01/25/20 - 02/29/20:The patient was treated with IMRT using volumetric arc therapy delivering 6 MV X-rays to clockwise and counterclockwise circumferential arcs with a 90 degree collimator offset to avoid dose scalloping.  Image guidance was performed with daily cone beam CT prior to each fraction to align to gold markers in the prostate and assure proper bladder and rectal fill volumes.  Immobilization was achieved with Body[employer] custom mold.  Narrative: The patient tolerated radiation treatment relatively well with only minor urinary irritation and modest fatigue.  He experienced mild dysuria, intermittent stream, hesitancy, feelings of incomplete bladder emptying and frequency but denied gross hematuria, straining or leakage. He denied bowel issues.  Plan: The patient has completed radiation treatment. He will return to radiation oncology clinic for routine followup in one month. I advised him to call or return sooner if he has any questions or concerns related to his recovery or treatment. ________________________________  Sheral Apley. Tammi Klippel, M.D.

## 2020-04-06 ENCOUNTER — Ambulatory Visit
Admission: RE | Admit: 2020-04-06 | Discharge: 2020-04-06 | Disposition: A | Discharge status: Home / Self Care | Payer: PPO | Source: Ambulatory Visit | Attending: Urology | Admitting: Urology

## 2020-04-06 ENCOUNTER — Other Ambulatory Visit: Payer: Self-pay

## 2020-04-06 DIAGNOSIS — C61 Malignant neoplasm of prostate: Secondary | ICD-10-CM

## 2020-04-07 DIAGNOSIS — G4733 Obstructive sleep apnea (adult) (pediatric): Secondary | ICD-10-CM | POA: Diagnosis not present

## 2020-04-13 DIAGNOSIS — C61 Malignant neoplasm of prostate: Secondary | ICD-10-CM | POA: Diagnosis not present

## 2020-04-20 DIAGNOSIS — R351 Nocturia: Secondary | ICD-10-CM | POA: Diagnosis not present

## 2020-04-20 DIAGNOSIS — R3912 Poor urinary stream: Secondary | ICD-10-CM | POA: Diagnosis not present

## 2020-04-20 DIAGNOSIS — C61 Malignant neoplasm of prostate: Secondary | ICD-10-CM | POA: Diagnosis not present

## 2020-05-06 DIAGNOSIS — Z23 Encounter for immunization: Secondary | ICD-10-CM | POA: Diagnosis not present

## 2020-05-27 DIAGNOSIS — C61 Malignant neoplasm of prostate: Secondary | ICD-10-CM | POA: Diagnosis not present

## 2020-08-02 DIAGNOSIS — E559 Vitamin D deficiency, unspecified: Secondary | ICD-10-CM | POA: Diagnosis not present

## 2020-08-02 DIAGNOSIS — Z0000 Encounter for general adult medical examination without abnormal findings: Secondary | ICD-10-CM | POA: Diagnosis not present

## 2020-08-02 DIAGNOSIS — G44009 Cluster headache syndrome, unspecified, not intractable: Secondary | ICD-10-CM | POA: Diagnosis not present

## 2020-08-02 DIAGNOSIS — I10 Essential (primary) hypertension: Secondary | ICD-10-CM | POA: Diagnosis not present

## 2020-08-02 DIAGNOSIS — R5383 Other fatigue: Secondary | ICD-10-CM | POA: Diagnosis not present

## 2020-08-02 DIAGNOSIS — R7309 Other abnormal glucose: Secondary | ICD-10-CM | POA: Diagnosis not present

## 2020-08-02 DIAGNOSIS — N5201 Erectile dysfunction due to arterial insufficiency: Secondary | ICD-10-CM | POA: Diagnosis not present

## 2020-08-02 DIAGNOSIS — E782 Mixed hyperlipidemia: Secondary | ICD-10-CM | POA: Diagnosis not present

## 2020-08-02 DIAGNOSIS — I6521 Occlusion and stenosis of right carotid artery: Secondary | ICD-10-CM | POA: Diagnosis not present

## 2020-08-02 DIAGNOSIS — C61 Malignant neoplasm of prostate: Secondary | ICD-10-CM | POA: Diagnosis not present

## 2020-08-02 DIAGNOSIS — G4733 Obstructive sleep apnea (adult) (pediatric): Secondary | ICD-10-CM | POA: Diagnosis not present

## 2020-08-04 IMAGING — US US ABDOMEN LIMITED
1 series · 13 of 25 positions shown · non-contrast
Comparison: None.

CLINICAL DATA: Elevated liver enzymes

EXAM:
ULTRASOUND ABDOMEN LIMITED RIGHT UPPER QUADRANT

[Series 1: us abdomen limited · 0.25mm/px · 13 of 61 slices shown]
[im 1/61]
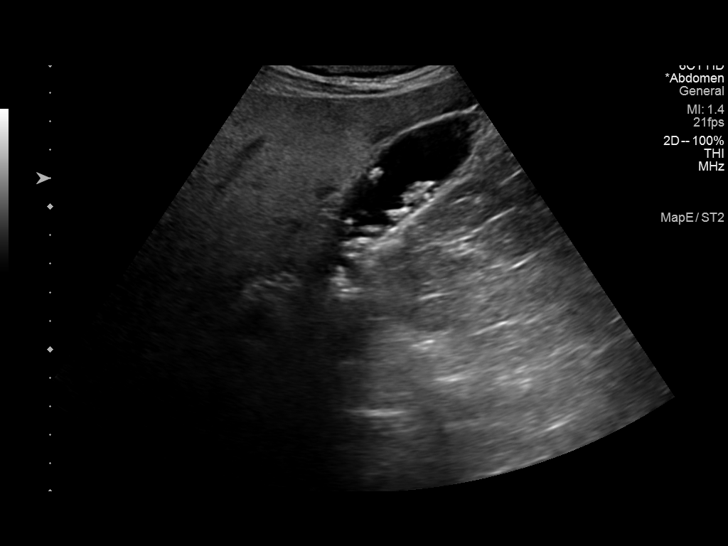
[im 6/61]
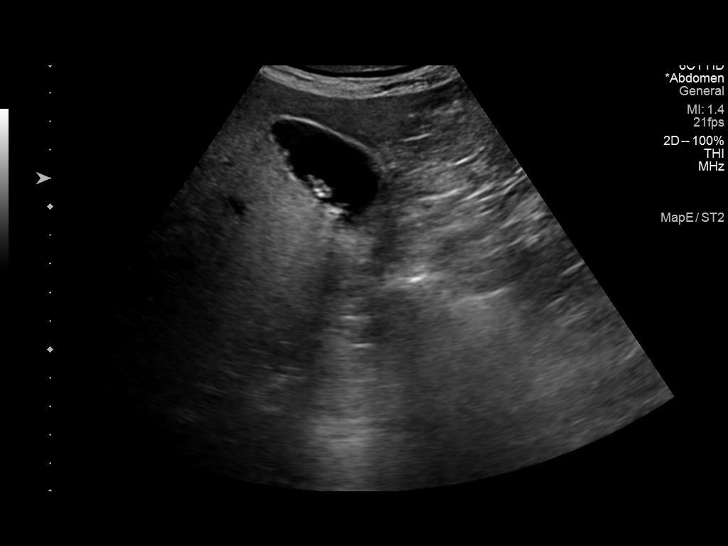
[im 11/61]
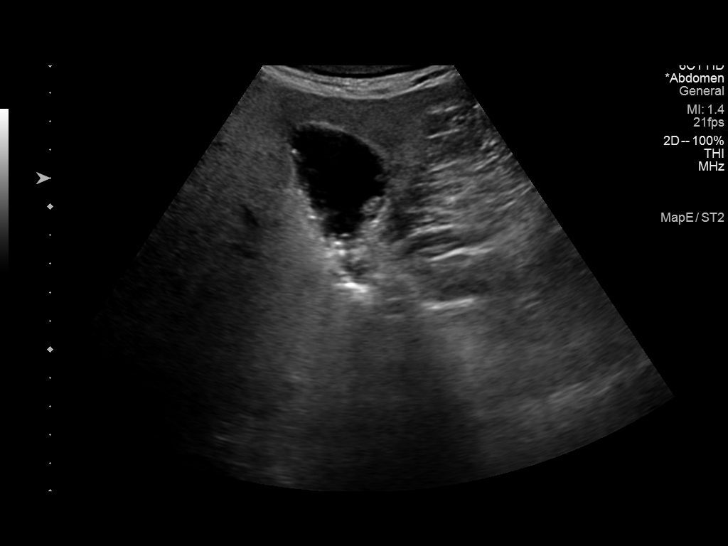
[im 16/61]
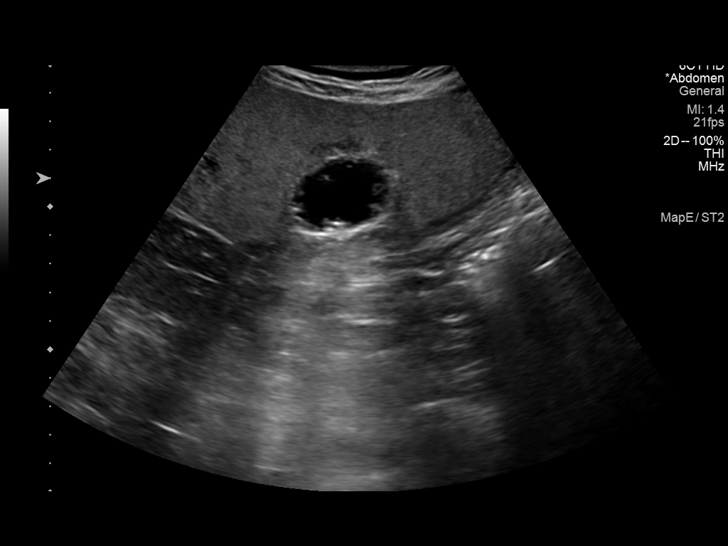
[im 21/61]
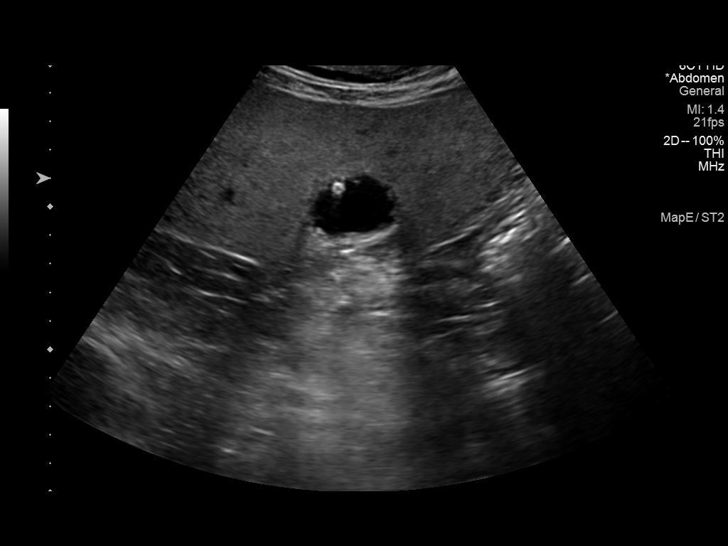
[im 26/61]
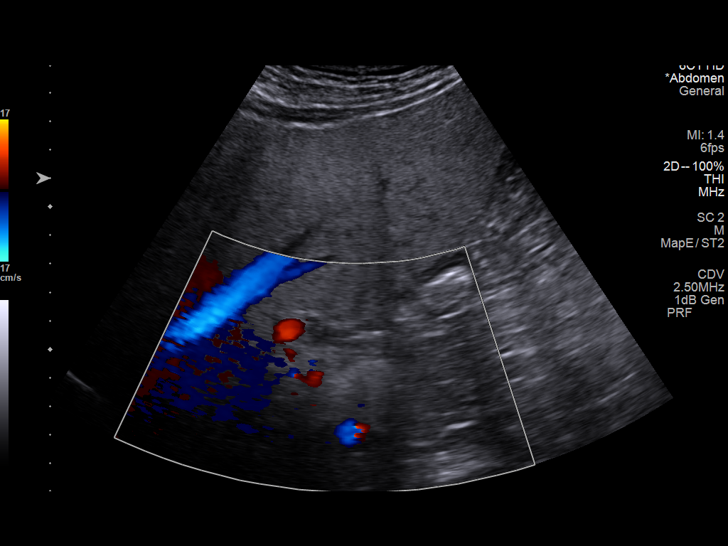
[im 31/61]
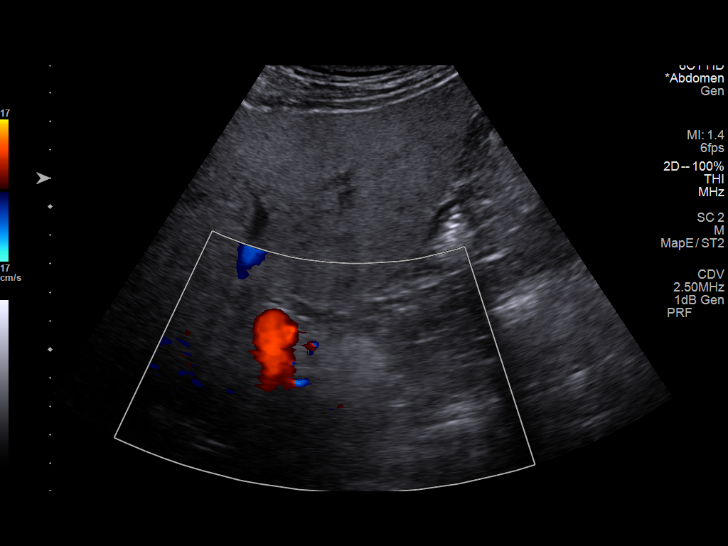
[im 36/61]
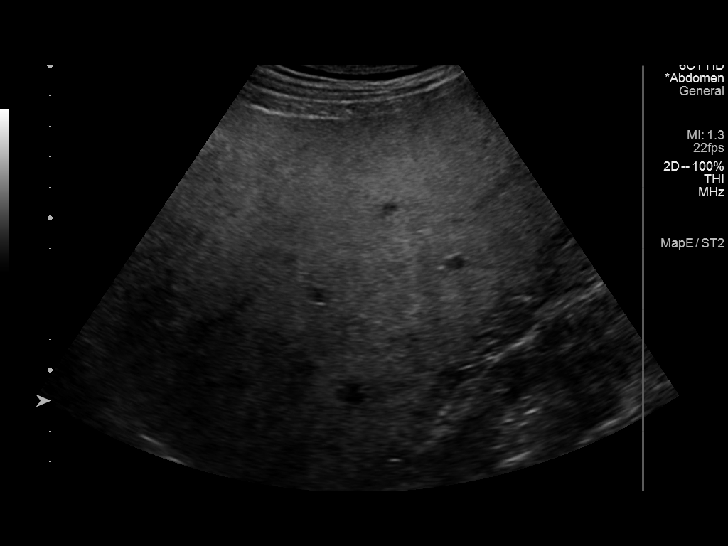
[im 41/61]
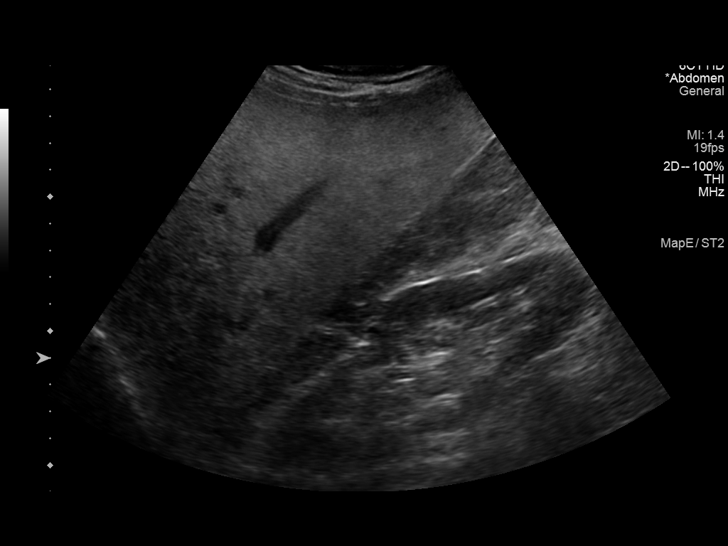
[im 46/61]
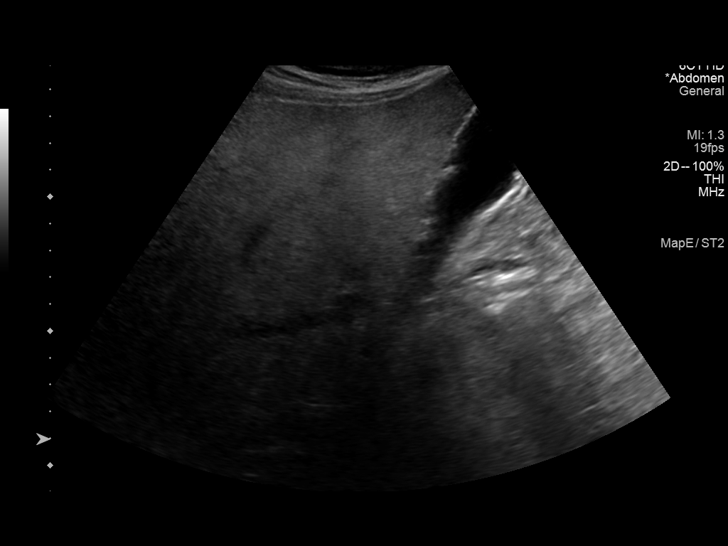
[im 51/61]
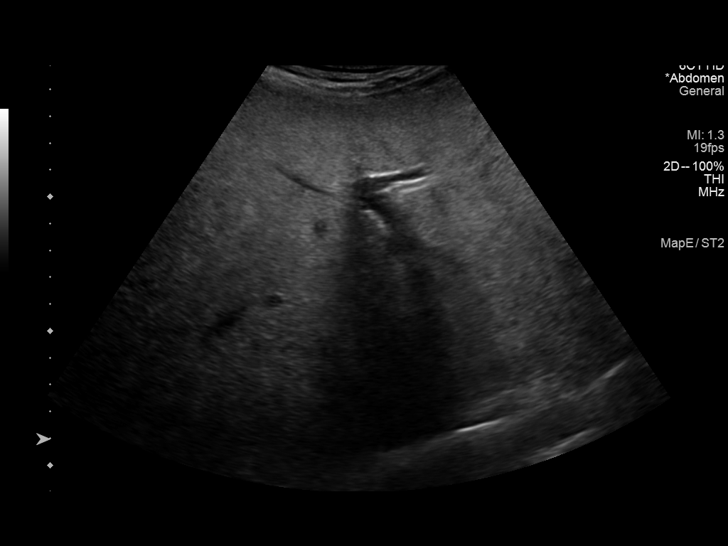
[im 56/61]
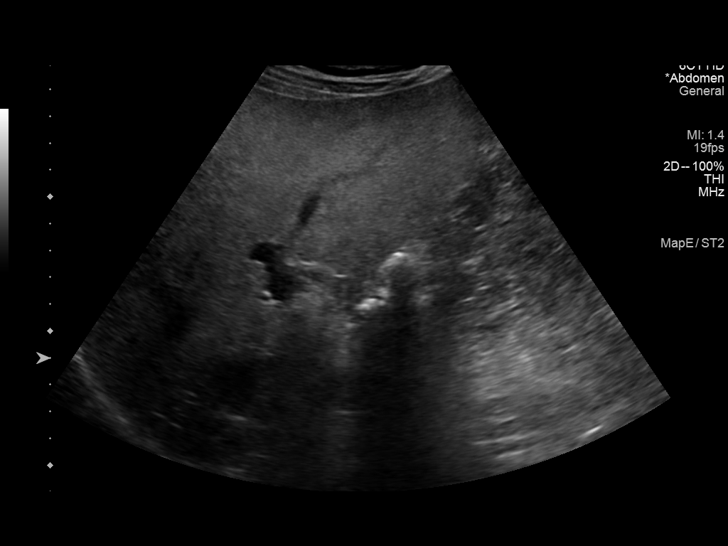
[im 61/61]
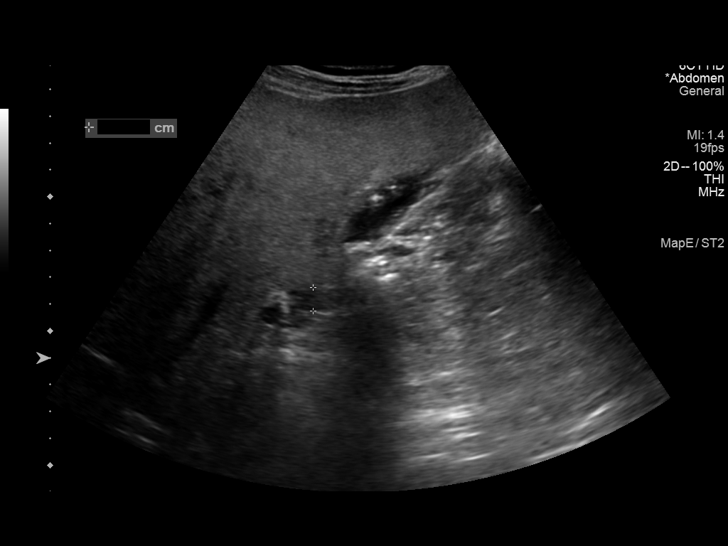

[13 of 25 positions shown; findings below may reference images not displayed]

FINDINGS: Gallbladder:

Within the gallbladder, there are multiple echogenic foci which move
and shadow consistent with cholelithiasis. Largest gallstone
measures 8 mm in length. There is a 5 mm polyp along the anterior
wall of the gallbladder as well. No gallbladder wall thickening or
pericholecystic fluid evident. No sonographic Murphy sign noted by
sonographer.

Common bile duct:

Diameter: 7 mm. No intrahepatic or extrahepatic biliary duct
dilatation.

Liver:

No focal lesion identified. Within liver echogenicity is increased
diffusely. There is slight fatty sparing near the gallbladder
fossa. Portal vein is patent on color Doppler imaging with normal
direction of blood flow towards the liver.

Other: None.
IMPRESSION: 1. Cholelithiasis. 5 mm polyp also present. No gallbladder wall
thickening or pericholecystic fluid.

2. Increased liver echogenicity consistent with hepatic steatosis.
Mild fatty sparing near the gallbladder wall. No focal liver lesions
evident beyond fatty sparing. Note that the sensitivity of
ultrasound for detection of focal liver lesions is somewhat
diminished in this circumstance.

## 2020-08-18 DIAGNOSIS — C61 Malignant neoplasm of prostate: Secondary | ICD-10-CM | POA: Diagnosis not present

## 2020-08-25 DIAGNOSIS — C61 Malignant neoplasm of prostate: Secondary | ICD-10-CM | POA: Diagnosis not present

## 2020-08-25 DIAGNOSIS — Z5111 Encounter for antineoplastic chemotherapy: Secondary | ICD-10-CM | POA: Diagnosis not present

## 2020-08-25 DIAGNOSIS — R351 Nocturia: Secondary | ICD-10-CM | POA: Diagnosis not present

## 2020-08-29 ENCOUNTER — Other Ambulatory Visit: Payer: Self-pay

## 2020-08-29 ENCOUNTER — Ambulatory Visit (HOSPITAL_COMMUNITY)
Admission: RE | Admit: 2020-08-29 | Discharge: 2020-08-29 | Disposition: A | Discharge status: Home / Self Care | Payer: PPO | Source: Ambulatory Visit | Attending: Physician Assistant | Admitting: Physician Assistant

## 2020-08-29 ENCOUNTER — Encounter: Payer: Self-pay | Admitting: Physician Assistant

## 2020-08-29 ENCOUNTER — Ambulatory Visit: Payer: PPO | Admitting: Physician Assistant

## 2020-08-29 VITALS — BP 153/86 | HR 86 | Temp 98.7°F | Resp 20 | Ht 69.0 in | Wt 227.0 lb

## 2020-08-29 DIAGNOSIS — I6523 Occlusion and stenosis of bilateral carotid arteries: Secondary | ICD-10-CM | POA: Insufficient documentation

## 2020-08-29 NOTE — Progress Notes (Signed)
Office Note     CC:  follow up Requesting Provider:  London Pepper, MD  HPI: Kyle English is a 70 y.o. (1950/09/20) male who presents for follow up for carotid artery disease. He has history of right CEA by Dr. Donzetta Matters in September of 2017. This was performed due to asymptomatic high grade right ICA stenosis. He has no history of TIA or stroke.   Since his last visit in January of 2021 he denies any new neurological symptoms. He denies any amaurosis fugax or changes in vision, slurred speech, facial drooping, weakness of upper or lower extremities.  He was recently diagnosed with Prostate cancer so he is currently undergoing treatment for that. He says overall he is doing well. Has some hot flashes due to the injections and fatigue but otherwise no other side effects  The pt is on a statin for cholesterol management.  The pt is on a daily aspirin.   Other AC: none The pt is on ACE for hypertension.   The pt is not diabetic.  Tobacco hx: never smoker  Past Medical History:  Diagnosis Date  . Arthritis   . Carotid stenosis, bilateral followed by vascular-- dr Donzetta Matters   s/p  right CEA 04-26-2016;  last carotid duplex 08-17-2019 in epic,  patent right CEA, left ICA 1-39%, bilateral ECA ,50%  . Cluster headaches    cluster headaches--- takes Verapamil  . Elevated LFTs    followed by pcp---   had abd. ultrasound in epic 12-22-2019  . GERD (gastroesophageal reflux disease)   . History of CEA (carotid endarterectomy) 04/23/2016   right side  ---- followed by vascular , dr Donzetta Matters  . History of kidney stones   . Hypertension    followed by pcp   (12-24-2019 per pt never had a stress test)  . Mixed hyperlipidemia   . Nephrolithiasis   . Nocturia   . OSA on CPAP   . Prostate cancer [facility]) urologist-- dr bell/  oncologist--- dr Tammi Klippel   dx 09-03-2019,  Stage T1c, Gleason 4+4  . Vitamin D deficiency     Past Surgical History:  Procedure Laterality Date  . ANTERIOR CERVICAL  DECOMP/DISCECTOMY FUSION  11-19-2003  @[facility]    C5 -- 7  . APPENDECTOMY  AGE 9  . COLONOSCOPY  09/2019  . CYSTOSCOPY/RETROGRADE/URETEROSCOPY/STONE EXTRACTION WITH BASKET  1990s  . ENDARTERECTOMY Right 04/23/2016   Procedure: RIGHT CAROTID ARTERY ENDARTERECTOMY;  Surgeon: Waynetta Sandy, MD;  Location: [facility];  Service: Vascular;  Laterality: Right;  . INGUINAL HERNIA REPAIR Right 10/05/2008   @[facility]   . PATCH ANGIOPLASTY Right 04/23/2016   Procedure: WITH 1 cm x 6 cm [place];  Surgeon: Waynetta Sandy, MD;  Location: [facility];  Service: Vascular;  Laterality: Right;  . PROSTATE BIOPSY    . RADIOACTIVE SEED IMPLANT N/A 12/30/2019   Procedure: RADIOACTIVE SEED IMPLANT/BRACHYTHERAPY IMPLANT;  Surgeon: Lucas Mallow, MD;  Location: [facility];  Service: Urology;  Laterality: N/A;  . SPACE OAR INSTILLATION N/A 12/30/2019   Procedure: SPACE OAR INSTILLATION;  Surgeon: Lucas Mallow, MD;  Location: [facility];  Service: Urology;  Laterality: N/A;  . UMBILICAL HERNIA REPAIR  11-06-2007  @[facility]     Social History   Socioeconomic History  . Marital status: Married    Spouse name: Lattie Haw  . Number of children: 3  . Years of education: Not on file  . Highest education level: Not on file  Occupational History  .  Not on file  Tobacco Use  . Smoking status: Never Smoker  . Smokeless tobacco: Never Used  Vaping Use  . Vaping Use: Never used  Substance and Sexual Activity  . Alcohol use: Not Currently    Comment: VERY RARE  . Drug use: Never  . Sexual activity: Not on file  Other Topics Concern  . Not on file  Social History Narrative  . Not on file   Social Determinants of Health   Financial Resource Strain: Not on file  Food Insecurity: Not on file  Transportation Needs: Not on file  Physical Activity: Not on file  Stress: Not on file  Social Connections: Not on file  Intimate Partner Violence: Not on file    Family  History  Problem Relation Age of Onset  . Diabetes Mother   . Hypertension Mother   . Prostate cancer Father        died at the age of 70  . Hypertension Sister   . CVA Maternal Grandmother 64  . Cancer Maternal Uncle        Uncertain  . Breast cancer Neg Hx   . Colon cancer Neg Hx   . Pancreatic cancer Neg Hx     Current Outpatient Medications  Medication Sig Dispense Refill  . aspirin 81 MG tablet take 1 tablet by mouth once daily at bedtime    . atorvastatin (LIPITOR) 80 MG tablet Take 80 mg by mouth daily.    [name] Kitchen dutasteride (AVODART) 0.5 MG capsule Take 0.5 mg by mouth at bedtime.     [name] Kitchen EPINEPHrine 0.3 mg/0.3 mL IJ SOAJ injection Inject 0.3 mg into the muscle as needed.     . Fish Oil-Cholecalciferol (FISH OIL + D3) 1200-1000 MG-UNIT CAPS Take 1 capsule by mouth daily.     . fluticasone (FLONASE) 50 MCG/ACT nasal spray Place 2 sprays into both nostrils as needed.     . Glucos-Chondroit-Hyaluron-MSM (GLUCOSAMINE CHONDROITIN JOINT) TABS Take 1 tablet by mouth in the morning and at bedtime.     . Multiple Vitamins-Minerals (MULTI ADULT GUMMIES) CHEW Chew 2 tablets by mouth daily.     . niacin 500 MG tablet Take 500 mg by mouth daily. Taking one tablet once in the morning.    [name] Kitchen omeprazole (PRILOSEC OTC) 20 MG tablet Take 20 mg by mouth daily.     . sildenafil (REVATIO) 20 MG tablet Take 20 mg by mouth as needed (ED).     . tamsulosin (FLOMAX) 0.4 MG CAPS capsule Take 0.4 mg by mouth daily.    . verapamil (CALAN-SR) 240 MG CR tablet Take 240 mg by mouth daily.      No current facility-administered medications for this visit.    No Known Allergies   REVIEW OF SYSTEMS:  [X]  denotes positive finding, [ ]  denotes negative finding Cardiac  Comments:  Chest pain or chest pressure:    Shortness of breath upon exertion:    Short of breath when lying flat:    Irregular heart rhythm:        Vascular    Pain in calf, thigh, or hip brought on by ambulation:    Pain in feet at night  that wakes you up from your sleep:     Blood clot in your veins:    Leg swelling:  X  left greater than right, no associated pain. Improved some with elevation      Pulmonary    Oxygen at home:    Productive cough:  Wheezing:         Neurologic    Sudden weakness in arms or legs:     Sudden numbness in arms or legs:     Sudden onset of difficulty speaking or slurred speech:    Temporary loss of vision in one eye:     Problems with dizziness:         Gastrointestinal    Blood in stool:     Vomited blood:         Genitourinary    Burning when urinating:     Blood in urine:        Psychiatric    Major depression:         Hematologic    Bleeding problems:    Problems with blood clotting too easily:        Skin    Rashes or ulcers:        Constitutional    Fever or chills:      PHYSICAL EXAMINATION:  Vitals:   08/29/20 1425  BP: (!) 153/86  Pulse: 86  Resp: 20  Temp: 98.7 F (37.1 C)  TempSrc: Temporal  SpO2: 99%  Weight: 227 lb (103 kg)  Height: 5\' 9"  (1.753 m)    General:  WDWN in NAD; vital signs documented above Gait: Normal HENT: WNL, normocephalic Pulmonary: normal non-labored breathing , without wheezing Cardiac: regular HR, without  Murmurs without carotid bruit Abdomen: soft, NT, no masses Vascular Exam/Pulses:  Right Left  Radial 2+ (normal) 2+ (normal)  Femoral 2+ (normal) 2+ (normal)  Popliteal Not palpable Not palpable  DP 2+ (normal) 2+ (normal)  PT Difficult to palpate secondary to edema Difficult to palpate secondary to edema   Extremities: without ischemic changes, without Gangrene , without cellulitis; without open wounds; bilateral lower extremity edema left greater than right Musculoskeletal: no muscle wasting or atrophy  Neurologic: A&O X 3;  No focal weakness or paresthesias are detected Psychiatric:  The pt has Normal affect.   Non-Invasive Vascular Imaging:   VAS Carotid Duplex: Summary:  Right Carotid: Velocities in  the right ICA are consistent with a 1-39% stenosis. Patent right carotid endarterectomy.   Left Carotid: Velocities in the left ICA are consistent with a 1-39% stenosis.   Vertebrals: Bilateral vertebral arteries demonstrate antegrade flow.     ASSESSMENT/PLAN:: 70 y.o. male here for follow up for carotid artery disease. He has history of right CEA by Dr. Donzetta Matters in September of 2017.He remains without any new neurological symptoms. His duplex today shows patient carotid arteries bilaterally with 1-39% stenosis. He has no recurrence of carotid stenosis on the right s/p CEA - Reviewed signs and symptoms of TIA/ Stroke and should these occur he knows to seek immediate medical attention - He will continue Aspirin and statin - He will follow up in 1 year with Carotid Duplex   Karoline Caldwell, PA-C [facility] [phone]  Clinic MD: Dr. Trula Slade

## 2020-09-29 DIAGNOSIS — L814 Other melanin hyperpigmentation: Secondary | ICD-10-CM | POA: Diagnosis not present

## 2020-09-29 DIAGNOSIS — L820 Inflamed seborrheic keratosis: Secondary | ICD-10-CM | POA: Diagnosis not present

## 2020-09-29 DIAGNOSIS — L570 Actinic keratosis: Secondary | ICD-10-CM | POA: Diagnosis not present

## 2020-09-29 DIAGNOSIS — D1801 Hemangioma of skin and subcutaneous tissue: Secondary | ICD-10-CM | POA: Diagnosis not present

## 2020-09-29 DIAGNOSIS — D225 Melanocytic nevi of trunk: Secondary | ICD-10-CM | POA: Diagnosis not present

## 2020-09-29 DIAGNOSIS — L821 Other seborrheic keratosis: Secondary | ICD-10-CM | POA: Diagnosis not present

## 2020-09-29 DIAGNOSIS — L578 Other skin changes due to chronic exposure to nonionizing radiation: Secondary | ICD-10-CM | POA: Diagnosis not present

## 2020-10-13 DIAGNOSIS — G4733 Obstructive sleep apnea (adult) (pediatric): Secondary | ICD-10-CM | POA: Diagnosis not present

## 2020-11-13 DIAGNOSIS — G4733 Obstructive sleep apnea (adult) (pediatric): Secondary | ICD-10-CM | POA: Diagnosis not present

## 2020-11-25 DIAGNOSIS — C61 Malignant neoplasm of prostate: Secondary | ICD-10-CM | POA: Diagnosis not present

## 2020-11-25 DIAGNOSIS — R3121 Asymptomatic microscopic hematuria: Secondary | ICD-10-CM | POA: Diagnosis not present

## 2020-11-25 DIAGNOSIS — R351 Nocturia: Secondary | ICD-10-CM | POA: Diagnosis not present

## 2020-12-07 DIAGNOSIS — J309 Allergic rhinitis, unspecified: Secondary | ICD-10-CM | POA: Diagnosis not present

## 2020-12-07 DIAGNOSIS — G4733 Obstructive sleep apnea (adult) (pediatric): Secondary | ICD-10-CM | POA: Diagnosis not present

## 2020-12-13 DIAGNOSIS — G4733 Obstructive sleep apnea (adult) (pediatric): Secondary | ICD-10-CM | POA: Diagnosis not present

## 2021-01-13 DIAGNOSIS — G4733 Obstructive sleep apnea (adult) (pediatric): Secondary | ICD-10-CM | POA: Diagnosis not present

## 2021-02-09 DIAGNOSIS — U071 COVID-19: Secondary | ICD-10-CM | POA: Diagnosis not present

## 2021-02-09 DIAGNOSIS — C61 Malignant neoplasm of prostate: Secondary | ICD-10-CM | POA: Diagnosis not present

## 2021-02-09 DIAGNOSIS — R059 Cough, unspecified: Secondary | ICD-10-CM | POA: Diagnosis not present

## 2021-02-09 DIAGNOSIS — G4733 Obstructive sleep apnea (adult) (pediatric): Secondary | ICD-10-CM | POA: Diagnosis not present

## 2021-02-12 DIAGNOSIS — G4733 Obstructive sleep apnea (adult) (pediatric): Secondary | ICD-10-CM | POA: Diagnosis not present

## 2021-02-19 DIAGNOSIS — R051 Acute cough: Secondary | ICD-10-CM | POA: Diagnosis not present

## 2021-02-19 DIAGNOSIS — J0100 Acute maxillary sinusitis, unspecified: Secondary | ICD-10-CM | POA: Diagnosis not present

## 2021-02-23 DIAGNOSIS — C61 Malignant neoplasm of prostate: Secondary | ICD-10-CM | POA: Diagnosis not present

## 2021-03-02 DIAGNOSIS — R351 Nocturia: Secondary | ICD-10-CM | POA: Diagnosis not present

## 2021-03-02 DIAGNOSIS — C61 Malignant neoplasm of prostate: Secondary | ICD-10-CM | POA: Diagnosis not present

## 2021-03-02 DIAGNOSIS — Z5111 Encounter for antineoplastic chemotherapy: Secondary | ICD-10-CM | POA: Diagnosis not present

## 2021-03-02 DIAGNOSIS — Z87442 Personal history of urinary calculi: Secondary | ICD-10-CM | POA: Diagnosis not present

## 2021-03-07 DIAGNOSIS — C61 Malignant neoplasm of prostate: Secondary | ICD-10-CM | POA: Diagnosis not present

## 2021-03-07 DIAGNOSIS — I10 Essential (primary) hypertension: Secondary | ICD-10-CM | POA: Diagnosis not present

## 2021-03-07 DIAGNOSIS — E782 Mixed hyperlipidemia: Secondary | ICD-10-CM | POA: Diagnosis not present

## 2021-03-07 DIAGNOSIS — Z6833 Body mass index (BMI) 33.0-33.9, adult: Secondary | ICD-10-CM | POA: Diagnosis not present

## 2021-03-15 DIAGNOSIS — G4733 Obstructive sleep apnea (adult) (pediatric): Secondary | ICD-10-CM | POA: Diagnosis not present

## 2021-04-15 DIAGNOSIS — G4733 Obstructive sleep apnea (adult) (pediatric): Secondary | ICD-10-CM | POA: Diagnosis not present

## 2021-05-15 DIAGNOSIS — G4733 Obstructive sleep apnea (adult) (pediatric): Secondary | ICD-10-CM | POA: Diagnosis not present

## 2021-06-01 DIAGNOSIS — C61 Malignant neoplasm of prostate: Secondary | ICD-10-CM | POA: Diagnosis not present

## 2021-06-08 DIAGNOSIS — C61 Malignant neoplasm of prostate: Secondary | ICD-10-CM | POA: Diagnosis not present

## 2021-06-08 DIAGNOSIS — R351 Nocturia: Secondary | ICD-10-CM | POA: Diagnosis not present

## 2021-06-15 DIAGNOSIS — G4733 Obstructive sleep apnea (adult) (pediatric): Secondary | ICD-10-CM | POA: Diagnosis not present

## 2021-07-15 DIAGNOSIS — G4733 Obstructive sleep apnea (adult) (pediatric): Secondary | ICD-10-CM | POA: Diagnosis not present

## 2021-08-07 DIAGNOSIS — G4733 Obstructive sleep apnea (adult) (pediatric): Secondary | ICD-10-CM | POA: Diagnosis not present

## 2021-08-07 DIAGNOSIS — R7309 Other abnormal glucose: Secondary | ICD-10-CM | POA: Diagnosis not present

## 2021-08-07 DIAGNOSIS — Z0000 Encounter for general adult medical examination without abnormal findings: Secondary | ICD-10-CM | POA: Diagnosis not present

## 2021-08-07 DIAGNOSIS — N5201 Erectile dysfunction due to arterial insufficiency: Secondary | ICD-10-CM | POA: Diagnosis not present

## 2021-08-07 DIAGNOSIS — I10 Essential (primary) hypertension: Secondary | ICD-10-CM | POA: Diagnosis not present

## 2021-08-07 DIAGNOSIS — E559 Vitamin D deficiency, unspecified: Secondary | ICD-10-CM | POA: Diagnosis not present

## 2021-08-07 DIAGNOSIS — I6521 Occlusion and stenosis of right carotid artery: Secondary | ICD-10-CM | POA: Diagnosis not present

## 2021-08-07 DIAGNOSIS — C61 Malignant neoplasm of prostate: Secondary | ICD-10-CM | POA: Diagnosis not present

## 2021-08-07 DIAGNOSIS — E782 Mixed hyperlipidemia: Secondary | ICD-10-CM | POA: Diagnosis not present

## 2021-08-07 DIAGNOSIS — G44009 Cluster headache syndrome, unspecified, not intractable: Secondary | ICD-10-CM | POA: Diagnosis not present

## 2021-08-15 DIAGNOSIS — G4733 Obstructive sleep apnea (adult) (pediatric): Secondary | ICD-10-CM | POA: Diagnosis not present

## 2021-09-01 DIAGNOSIS — C61 Malignant neoplasm of prostate: Secondary | ICD-10-CM | POA: Diagnosis not present

## 2021-09-08 DIAGNOSIS — N5201 Erectile dysfunction due to arterial insufficiency: Secondary | ICD-10-CM | POA: Diagnosis not present

## 2021-09-08 DIAGNOSIS — C61 Malignant neoplasm of prostate: Secondary | ICD-10-CM | POA: Diagnosis not present

## 2021-09-08 DIAGNOSIS — N471 Phimosis: Secondary | ICD-10-CM | POA: Diagnosis not present

## 2021-09-12 ENCOUNTER — Other Ambulatory Visit: Payer: Self-pay

## 2021-09-12 DIAGNOSIS — I6523 Occlusion and stenosis of bilateral carotid arteries: Secondary | ICD-10-CM

## 2021-09-15 DIAGNOSIS — G4733 Obstructive sleep apnea (adult) (pediatric): Secondary | ICD-10-CM | POA: Diagnosis not present

## 2021-09-20 ENCOUNTER — Ambulatory Visit: Payer: PPO

## 2021-09-20 ENCOUNTER — Encounter (HOSPITAL_COMMUNITY): Payer: PPO

## 2021-09-29 DIAGNOSIS — B079 Viral wart, unspecified: Secondary | ICD-10-CM | POA: Diagnosis not present

## 2021-09-29 DIAGNOSIS — L814 Other melanin hyperpigmentation: Secondary | ICD-10-CM | POA: Diagnosis not present

## 2021-09-29 DIAGNOSIS — D225 Melanocytic nevi of trunk: Secondary | ICD-10-CM | POA: Diagnosis not present

## 2021-09-29 DIAGNOSIS — L918 Other hypertrophic disorders of the skin: Secondary | ICD-10-CM | POA: Diagnosis not present

## 2021-09-29 DIAGNOSIS — D1801 Hemangioma of skin and subcutaneous tissue: Secondary | ICD-10-CM | POA: Diagnosis not present

## 2021-09-29 DIAGNOSIS — L821 Other seborrheic keratosis: Secondary | ICD-10-CM | POA: Diagnosis not present

## 2021-09-29 DIAGNOSIS — L578 Other skin changes due to chronic exposure to nonionizing radiation: Secondary | ICD-10-CM | POA: Diagnosis not present

## 2021-09-29 DIAGNOSIS — L570 Actinic keratosis: Secondary | ICD-10-CM | POA: Diagnosis not present

## 2021-09-29 DIAGNOSIS — Z23 Encounter for immunization: Secondary | ICD-10-CM | POA: Diagnosis not present

## 2021-10-04 ENCOUNTER — Other Ambulatory Visit: Payer: Self-pay | Admitting: Ophthalmology

## 2021-10-04 ENCOUNTER — Encounter (HOSPITAL_COMMUNITY): Payer: PPO

## 2021-10-04 ENCOUNTER — Ambulatory Visit: Payer: PPO

## 2021-10-04 DIAGNOSIS — N481 Balanitis: Secondary | ICD-10-CM | POA: Diagnosis not present

## 2021-10-04 DIAGNOSIS — Q541 Hypospadias, penile: Secondary | ICD-10-CM | POA: Diagnosis not present

## 2021-10-04 DIAGNOSIS — N477 Other inflammatory diseases of prepuce: Secondary | ICD-10-CM | POA: Diagnosis not present

## 2021-10-04 DIAGNOSIS — N471 Phimosis: Secondary | ICD-10-CM | POA: Diagnosis not present

## 2021-10-13 DIAGNOSIS — G4733 Obstructive sleep apnea (adult) (pediatric): Secondary | ICD-10-CM | POA: Diagnosis not present

## 2021-11-02 DIAGNOSIS — C61 Malignant neoplasm of prostate: Secondary | ICD-10-CM | POA: Diagnosis not present

## 2021-11-02 DIAGNOSIS — N481 Balanitis: Secondary | ICD-10-CM | POA: Diagnosis not present

## 2021-11-15 ENCOUNTER — Ambulatory Visit (HOSPITAL_COMMUNITY)
Admission: RE | Admit: 2021-11-15 | Discharge: 2021-11-15 | Disposition: A | Discharge status: Home / Self Care | Payer: PPO | Source: Ambulatory Visit | Attending: Vascular Surgery | Admitting: Vascular Surgery

## 2021-11-15 ENCOUNTER — Ambulatory Visit: Payer: PPO | Admitting: Physician Assistant

## 2021-11-15 VITALS — BP 134/76 | HR 77 | Temp 98.3°F | Resp 20 | Ht 69.0 in | Wt 227.0 lb

## 2021-11-15 DIAGNOSIS — I6523 Occlusion and stenosis of bilateral carotid arteries: Secondary | ICD-10-CM | POA: Diagnosis not present

## 2021-11-15 DIAGNOSIS — L570 Actinic keratosis: Secondary | ICD-10-CM | POA: Insufficient documentation

## 2021-11-15 DIAGNOSIS — L814 Other melanin hyperpigmentation: Secondary | ICD-10-CM | POA: Insufficient documentation

## 2021-11-15 DIAGNOSIS — D1801 Hemangioma of skin and subcutaneous tissue: Secondary | ICD-10-CM | POA: Insufficient documentation

## 2021-11-15 DIAGNOSIS — D225 Melanocytic nevi of trunk: Secondary | ICD-10-CM | POA: Insufficient documentation

## 2021-11-15 NOTE — Progress Notes (Signed)
?Office Note  ? ? ? ?CC:  follow up ?Requesting Provider:  London Pepper, MD ? ?HPI: Kyle English is a 71 y.o. (11/21/1950) male who presents for surveillance of carotid artery stenosis.  He has history of right carotid endarterectomy by Dr. Donzetta Matters in September 2017.  This was performed due to asymptomatic high-grade stenosis of the ICA.  He denies any history of CVA or TIA.  He has not had any neurological events since last office visit.  He also denies any current strokelike symptoms including slurring speech, changes in vision, or one-sided weakness.  He recently received his last hormone injection for prostate cancer treatment and will follow up with his urologist in June with hopes to be in remission.  He denies tobacco use.  He is on aspirin and a statin daily. ? ? ?Past Medical History:  ?Diagnosis Date  ? Arthritis   ? Carotid stenosis, bilateral followed by vascular-- dr Donzetta Matters  ? s/p  right CEA 04-26-2016;  last carotid duplex 08-17-2019 in epic,  patent right CEA, left ICA 1-39%, bilateral ECA ,50%  ? Cluster headaches   ? cluster headaches--- takes Verapamil  ? Elevated LFTs   ? followed by pcp---   had abd. ultrasound in epic 12-22-2019  ? GERD (gastroesophageal reflux disease)   ? History of CEA (carotid endarterectomy) 04/23/2016  ? right side  ---- followed by vascular , dr Donzetta Matters  ? History of kidney stones   ? Hypertension   ? followed by pcp   (12-24-2019 per pt never had a stress test)  ? Mixed hyperlipidemia   ? Nephrolithiasis   ? Nocturia   ? OSA on CPAP   ? Prostate cancer [facility]) urologist-- dr bell/  oncologist--- dr Tammi Klippel  ? dx 09-03-2019,  Stage T1c, Gleason 4+4  ? Vitamin D deficiency   ? ? ?Past Surgical History:  ?Procedure Laterality Date  ? ANTERIOR CERVICAL DECOMP/DISCECTOMY FUSION  11-19-2003  '@[facility]'$   ? C5 -- 7  ? APPENDECTOMY  AGE 79  ? COLONOSCOPY  09/2019  ? CYSTOSCOPY/RETROGRADE/URETEROSCOPY/STONE EXTRACTION WITH BASKET  1990s  ? ENDARTERECTOMY Right 04/23/2016  ? Procedure: RIGHT  CAROTID ARTERY ENDARTERECTOMY;  Surgeon: Waynetta Sandy, MD;  Location: [place];  Service: Vascular;  Laterality: Right;  ? INGUINAL HERNIA REPAIR Right 10/05/2008   '@[facility]   ? PATCH ANGIOPLASTY Right 04/23/2016  ? Procedure: WITH 1 cm x 6 cm [place];  Surgeon: Waynetta Sandy, MD;  Location: [place];  Service: Vascular;  Laterality: Right;  ? PROSTATE BIOPSY    ? RADIOACTIVE SEED IMPLANT N/A 12/30/2019  ? Procedure: RADIOACTIVE SEED IMPLANT/BRACHYTHERAPY IMPLANT;  Surgeon: Lucas Mallow, MD;  Location: [facility];  Service: Urology;  Laterality: N/A;  ? SPACE OAR INSTILLATION N/A 12/30/2019  ? Procedure: SPACE OAR INSTILLATION;  Surgeon: Lucas Mallow, MD;  [facility]: [facility];  Service: Urology;  Laterality: N/A;  ? UMBILICAL HERNIA REPAIR  11-06-2007  '@[facility]'$   ? ? ?Social History  ? ?Socioeconomic History  ? Marital status: Married  ?  Spouse name: Lattie Haw  ? Number of children: 3  ? Years of education: Not on file  ? Highest education level: Not on file  ?Occupational History  ? Not on file  ?Tobacco Use  ? Smoking status: Never  ?  Passive exposure: Never  ? Smokeless tobacco: Never  ?Vaping Use  ? Vaping Use: Never used  ?Substance and Sexual Activity  ? Alcohol use: Not Currently  ?  Comment: VERY RARE  ? Drug use: Never  ? Sexual activity: Not on file  ?Other Topics Concern  ? Not on file  ?Social History Narrative  ? Not on file  ? ?Social Determinants of Health  ? ?Financial Resource Strain: Not on file  ?Food Insecurity: Not on file  ?Transportation Needs: Not on file  ?Physical Activity: Not on file  ?Stress: Not on file  ?Social Connections: Not on file  ?Intimate Partner Violence: Not on file  ? ?  ?Family History  ?Problem Relation Age of Onset  ? Diabetes Mother   ? Hypertension Mother   ? Prostate cancer Father   ?     died at the age of 57  ? Hypertension Sister   ? CVA Maternal Grandmother 73  ? Cancer Maternal Uncle   ?     Uncertain   ? Breast cancer Neg Hx   ? Colon cancer Neg Hx   ? Pancreatic cancer Neg Hx   ? ? ?Current Outpatient Medications  ?Medication Sig Dispense Refill  ? aspirin 81 MG tablet take 1 tablet by mouth once daily at bedtime    ? atorvastatin (LIPITOR) 80 MG tablet Take 80 mg by mouth daily.    ? dutasteride (AVODART) 0.5 MG capsule Take 0.5 mg by mouth at bedtime.     ? Fish Oil-Cholecalciferol (FISH OIL + D3) 1200-1000 MG-UNIT CAPS Take 1 capsule by mouth daily.     ? fluticasone (FLONASE) 50 MCG/ACT nasal spray Place 2 sprays into both nostrils as needed.     ? Glucos-Chondroit-Hyaluron-MSM (GLUCOSAMINE CHONDROITIN JOINT) TABS Take 1 tablet by mouth in the morning and at bedtime.     ? Multiple Vitamins-Minerals (MULTI ADULT GUMMIES) CHEW Chew 2 tablets by mouth daily.     ? niacin 500 MG tablet Take 500 mg by mouth daily. Taking one tablet once in the morning.    ? omeprazole (PRILOSEC OTC) 20 MG tablet Take 20 mg by mouth daily.     ? sildenafil (REVATIO) 20 MG tablet Take 20 mg by mouth as needed (ED).     ? tamsulosin (FLOMAX) 0.4 MG CAPS capsule Take 0.4 mg by mouth daily.    ? verapamil (CALAN-SR) 240 MG CR tablet Take 240 mg by mouth daily.     ? ?No current facility-administered medications for this visit.  ? ? ?No Known Allergies ? ? ?REVIEW OF SYSTEMS:  ?  ?'[X]'$  denotes positive finding, '[ ]'$  denotes negative finding ?Cardiac  Comments:  ?Chest pain or chest pressure:    ?Shortness of breath upon exertion:    ?Short of breath when lying flat:    ?Irregular heart rhythm:    ?    ?Vascular    ?Pain in calf, thigh, or hip brought on by ambulation:    ?Pain in feet at night that wakes you up from your sleep:     ?Blood clot in your veins:    ?Leg swelling:     ?    ?Pulmonary    ?Oxygen at home:    ?Productive cough:     ?Wheezing:     ?    ?Neurologic    ?Sudden weakness in arms or legs:     ?Sudden numbness in arms or legs:     ?Sudden onset of difficulty speaking or slurred speech:    ?Temporary loss of vision in  one eye:     ?Problems with dizziness:     ?    ?  Gastrointestinal    ?Blood in stool:     ?Vomited blood:     ?    ?Genitourinary    ?Burning when urinating:     ?Blood in urine:    ?    ?Psychiatric    ?Major depression:     ?    ?Hematologic    ?Bleeding problems:    ?Problems with blood clotting too easily:    ?    ?Skin    ?Rashes or ulcers:    ?    ?Constitutional    ?Fever or chills:    ? ? ?PHYSICAL EXAMINATION: ? ?Vitals:  ? 11/15/21 1329 11/15/21 1332  ?BP: (!) 142/80 134/76  ?Pulse: 77   ?Resp: 20   ?Temp: 98.3 ?F (36.8 ?C)   ?TempSrc: Temporal   ?SpO2: 97%   ?Weight: 227 lb (103 kg)   ?Height: '5\' 9"'$  (1.753 m)   ? ? ?General:  WDWN in NAD; vital signs documented above ?Gait: Not observed ?HENT: WNL, normocephalic ?Pulmonary: normal non-labored breathing , without Rales, rhonchi,  wheezing ?Cardiac: regular HR ?Abdomen: soft, NT, no masses ?Skin: without rashes ?Vascular Exam/Pulses: ? Right Left  ?Radial 2+ (normal) 2+ (normal)  ? ?Extremities: without ischemic changes, without Gangrene , without cellulitis; without open wounds;  ?Musculoskeletal: no muscle wasting or atrophy  ?Neurologic: A&O X 3;  CN grossly intact ?Psychiatric:  The pt has Normal affect. ? ? ?Non-Invasive Vascular Imaging:   ?R ICA 1-39% stenosis ?L ICA 1-39% stenosis ? ? ? ?ASSESSMENT/PLAN:: 71 y.o. male here for follow up for carotid artery stenosis surveillance ? ?-Subjectively the patient has not had any neurologic events since last office visit ?-Right carotid endarterectomy surgery site is widely patent without recurrent stenosis.  Left ICA estimated 1 to 39% stenotic. ?-Continue aspirin and statin daily ?-Recheck carotid duplex in 1 year per protocol ? ? ?Dagoberto Ligas, PA-C ?[facility] ?[phone] ? ?Clinic MD:   Donzetta Matters ? ?

## 2021-12-06 DIAGNOSIS — G4733 Obstructive sleep apnea (adult) (pediatric): Secondary | ICD-10-CM | POA: Diagnosis not present

## 2021-12-07 DIAGNOSIS — G4733 Obstructive sleep apnea (adult) (pediatric): Secondary | ICD-10-CM | POA: Diagnosis not present

## 2022-02-13 DIAGNOSIS — C61 Malignant neoplasm of prostate: Secondary | ICD-10-CM | POA: Diagnosis not present

## 2022-02-13 DIAGNOSIS — E782 Mixed hyperlipidemia: Secondary | ICD-10-CM | POA: Diagnosis not present

## 2022-02-13 DIAGNOSIS — Z6834 Body mass index (BMI) 34.0-34.9, adult: Secondary | ICD-10-CM | POA: Diagnosis not present

## 2022-02-13 DIAGNOSIS — M25522 Pain in left elbow: Secondary | ICD-10-CM | POA: Diagnosis not present

## 2022-02-13 DIAGNOSIS — M25551 Pain in right hip: Secondary | ICD-10-CM | POA: Diagnosis not present

## 2022-02-13 DIAGNOSIS — I10 Essential (primary) hypertension: Secondary | ICD-10-CM | POA: Diagnosis not present

## 2022-02-21 DIAGNOSIS — M7712 Lateral epicondylitis, left elbow: Secondary | ICD-10-CM | POA: Diagnosis not present

## 2022-02-28 DIAGNOSIS — C61 Malignant neoplasm of prostate: Secondary | ICD-10-CM | POA: Diagnosis not present

## 2022-03-08 DIAGNOSIS — E349 Endocrine disorder, unspecified: Secondary | ICD-10-CM | POA: Diagnosis not present

## 2022-03-08 DIAGNOSIS — C61 Malignant neoplasm of prostate: Secondary | ICD-10-CM | POA: Diagnosis not present

## 2022-07-14 DIAGNOSIS — M546 Pain in thoracic spine: Secondary | ICD-10-CM | POA: Diagnosis not present

## 2022-08-14 DIAGNOSIS — I10 Essential (primary) hypertension: Secondary | ICD-10-CM | POA: Diagnosis not present

## 2022-08-14 DIAGNOSIS — G4733 Obstructive sleep apnea (adult) (pediatric): Secondary | ICD-10-CM | POA: Diagnosis not present

## 2022-08-14 DIAGNOSIS — C61 Malignant neoplasm of prostate: Secondary | ICD-10-CM | POA: Diagnosis not present

## 2022-08-14 DIAGNOSIS — E559 Vitamin D deficiency, unspecified: Secondary | ICD-10-CM | POA: Diagnosis not present

## 2022-08-14 DIAGNOSIS — Z0000 Encounter for general adult medical examination without abnormal findings: Secondary | ICD-10-CM | POA: Diagnosis not present

## 2022-08-14 DIAGNOSIS — G44009 Cluster headache syndrome, unspecified, not intractable: Secondary | ICD-10-CM | POA: Diagnosis not present

## 2022-08-14 DIAGNOSIS — E782 Mixed hyperlipidemia: Secondary | ICD-10-CM | POA: Diagnosis not present

## 2022-08-14 DIAGNOSIS — I6521 Occlusion and stenosis of right carotid artery: Secondary | ICD-10-CM | POA: Diagnosis not present

## 2022-08-14 DIAGNOSIS — R7309 Other abnormal glucose: Secondary | ICD-10-CM | POA: Diagnosis not present

## 2022-09-06 DIAGNOSIS — C61 Malignant neoplasm of prostate: Secondary | ICD-10-CM | POA: Diagnosis not present

## 2022-09-13 DIAGNOSIS — C61 Malignant neoplasm of prostate: Secondary | ICD-10-CM | POA: Diagnosis not present

## 2022-10-08 DIAGNOSIS — L918 Other hypertrophic disorders of the skin: Secondary | ICD-10-CM | POA: Diagnosis not present

## 2022-10-08 DIAGNOSIS — L821 Other seborrheic keratosis: Secondary | ICD-10-CM | POA: Diagnosis not present

## 2022-10-08 DIAGNOSIS — L570 Actinic keratosis: Secondary | ICD-10-CM | POA: Diagnosis not present

## 2022-10-08 DIAGNOSIS — L814 Other melanin hyperpigmentation: Secondary | ICD-10-CM | POA: Diagnosis not present

## 2022-10-08 DIAGNOSIS — D1801 Hemangioma of skin and subcutaneous tissue: Secondary | ICD-10-CM | POA: Diagnosis not present

## 2022-10-08 DIAGNOSIS — L578 Other skin changes due to chronic exposure to nonionizing radiation: Secondary | ICD-10-CM | POA: Diagnosis not present

## 2022-10-08 DIAGNOSIS — D225 Melanocytic nevi of trunk: Secondary | ICD-10-CM | POA: Diagnosis not present

## 2022-11-08 ENCOUNTER — Other Ambulatory Visit: Payer: Self-pay | Admitting: *Deleted

## 2022-11-08 DIAGNOSIS — I6523 Occlusion and stenosis of bilateral carotid arteries: Secondary | ICD-10-CM

## 2022-11-09 ENCOUNTER — Ambulatory Visit (HOSPITAL_COMMUNITY)
Admission: RE | Admit: 2022-11-09 | Discharge: 2022-11-09 | Disposition: A | Discharge status: Home / Self Care | Payer: PPO | Source: Ambulatory Visit | Attending: Vascular Surgery | Admitting: Vascular Surgery

## 2022-11-09 DIAGNOSIS — I6523 Occlusion and stenosis of bilateral carotid arteries: Secondary | ICD-10-CM | POA: Diagnosis not present

## 2022-12-05 ENCOUNTER — Ambulatory Visit (INDEPENDENT_AMBULATORY_CARE_PROVIDER_SITE_OTHER): Payer: PPO | Admitting: Physician Assistant

## 2022-12-05 VITALS — BP 132/67 | HR 81 | Temp 97.8°F | Wt 233.0 lb

## 2022-12-05 DIAGNOSIS — I6523 Occlusion and stenosis of bilateral carotid arteries: Secondary | ICD-10-CM | POA: Diagnosis not present

## 2022-12-05 NOTE — Progress Notes (Signed)
History of Present Illness:  Patient is a 72 y.o. year old male who presents for evaluation of carotid stenosis.  S/p right asymptomatic CEA by Dr. Randie Heinz on 04/23/2016.    The patient denies symptoms of TIA, amaurosis, or stroke.  He is medically managed on ASA and Statin daily.  He states he has dizzy spells and they are caused by dehydration.  As long as he drinks enough water daily he does not have dizzy spells.      Past Medical History:  Diagnosis Date   Arthritis    Carotid stenosis, bilateral followed by vascular-- dr Randie Heinz   s/p  right CEA 04-26-2016;  last carotid duplex 08-17-2019 in epic,  patent right CEA, left ICA 1-39%, bilateral ECA ,50%   Cluster headaches    cluster headaches--- takes Verapamil   Elevated LFTs    followed by pcp---   had abd. ultrasound in epic 12-22-2019   GERD (gastroesophageal reflux disease)    History of CEA (carotid endarterectomy) 04/23/2016   right side  ---- followed by vascular , dr Randie Heinz   History of kidney stones    Hypertension    followed by pcp   (12-24-2019 per pt never had a stress test)   Mixed hyperlipidemia    Nephrolithiasis    Nocturia    OSA on CPAP    Prostate cancer [facility]) urologist-- dr bell/  oncologist--- dr Kathrynn Running   dx 09-03-2019,  Stage T1c, Gleason 4+4   Vitamin D deficiency     Past Surgical History:  Procedure Laterality Date   ANTERIOR CERVICAL DECOMP/DISCECTOMY FUSION  11-19-2003  @[facility]    C5 -- 7   APPENDECTOMY  AGE 67   COLONOSCOPY  09/2019   CYSTOSCOPY/RETROGRADE/URETEROSCOPY/STONE EXTRACTION WITH BASKET  1990s   ENDARTERECTOMY Right 04/23/2016   Procedure: RIGHT CAROTID ARTERY ENDARTERECTOMY;  Surgeon: Maeola Harman, MD;  Location: [facility] OR;  Service: Vascular;  Laterality: Right;   INGUINAL HERNIA REPAIR Right 10/05/2008   @[facility]    PATCH ANGIOPLASTY Right 04/23/2016   Procedure: WITH 1 cm x 6 cm XENOSURE PATCH ANGIOPLASTY;  Surgeon: Maeola Harman, MD;  Location: [facility];  Service:  Vascular;  Laterality: Right;   PROSTATE BIOPSY     RADIOACTIVE SEED IMPLANT N/A 12/30/2019   Procedure: RADIOACTIVE SEED IMPLANT/BRACHYTHERAPY IMPLANT;  Surgeon: Crista Elliot, MD;  Location: [facility];  Service: Urology;  Laterality: N/A;   SPACE OAR INSTILLATION N/A 12/30/2019   Procedure: SPACE OAR INSTILLATION;  Surgeon: Crista Elliot, MD;  Location: [facility];  Service: Urology;  Laterality: N/A;   UMBILICAL HERNIA REPAIR  11-06-2007  @[facility]      Social History Social History   Tobacco Use   Smoking status: Never    Passive exposure: Never   Smokeless tobacco: Never  Vaping Use   Vaping Use: Never used  Substance Use Topics   Alcohol use: Not Currently    Comment: VERY RARE   Drug use: Never    Family History Family History  Problem Relation Age of Onset   Diabetes Mother    Hypertension Mother    Prostate cancer Father        died at the age of 25   Hypertension Sister    CVA Maternal Grandmother 86   Cancer Maternal Uncle        Uncertain   Breast cancer Neg Hx    Colon cancer Neg Hx    Pancreatic cancer Neg Hx  Allergies  No Known Allergies   Current Outpatient Medications  Medication Sig Dispense Refill   aspirin 81 MG tablet take 1 tablet by mouth once daily at bedtime     atorvastatin (LIPITOR) 80 MG tablet Take 80 mg by mouth daily.     dutasteride (AVODART) 0.5 MG capsule Take 0.5 mg by mouth at bedtime.      Fish Oil-Cholecalciferol (FISH OIL + D3) 1200-1000 MG-UNIT CAPS Take 1 capsule by mouth daily.      fluticasone (FLONASE) 50 MCG/ACT nasal spray Place 2 sprays into both nostrils as needed.      Glucos-Chondroit-Hyaluron-MSM (GLUCOSAMINE CHONDROITIN JOINT) TABS Take 1 tablet by mouth in the morning and at bedtime.      Multiple Vitamins-Minerals (MULTI ADULT GUMMIES) CHEW Chew 2 tablets by mouth daily.      niacin 500 MG tablet Take 500 mg by mouth daily. Taking one tablet once in the morning.      omeprazole (PRILOSEC OTC) 20 MG tablet Take 20 mg by mouth daily.      sildenafil (REVATIO) 20 MG tablet Take 20 mg by mouth as needed (ED).      tamsulosin (FLOMAX) 0.4 MG CAPS capsule Take 0.4 mg by mouth daily.     verapamil (CALAN-SR) 240 MG CR tablet Take 240 mg by mouth daily.      No current facility-administered medications for this visit.    ROS:   General:  No weight loss, Fever, chills  HEENT: No recent headaches, no nasal bleeding, no visual changes, no sore throat  Neurologic: No dizziness, blackouts, seizures. No recent symptoms of stroke or mini- stroke. No recent episodes of slurred speech, or temporary blindness.  Cardiac: No recent episodes of chest pain/pressure, no shortness of breath at rest.  No shortness of breath with exertion.  Denies history of atrial fibrillation or irregular heartbeat  Vascular: No history of rest pain in feet.  No history of claudication.  No history of non-healing ulcer, No history of DVT   Pulmonary: No home oxygen, no productive cough, no hemoptysis,  No asthma or wheezing  Musculoskeletal:  [ ]  Arthritis, [ ]  Low back pain,  [ ]  Joint pain  Hematologic:No history of hypercoagulable state.  No history of easy bleeding.  No history of anemia  Gastrointestinal: No hematochezia or melena,  No gastroesophageal reflux, no trouble swallowing  Urinary: [ ]  chronic Kidney disease, [ ]  on HD - [ ]  MWF or [ ]  TTHS, [ ]  Burning with urination, [ ]  Frequent urination, [ ]  Difficulty urinating;   Skin: No rashes  Psychological: No history of anxiety,  No history of depression   Physical Examination  There were no vitals filed for this visit.  There is no height or weight on file to calculate BMI.  General:  Alert and oriented, no acute distress HEENT: Normal Neck: No bruit or JVD Pulmonary: Clear to auscultation bilaterally Cardiac: Regular Rate and Rhythm without murmur Gastrointestinal: Soft, non-tender, non-distended, no mass, no  scars Skin: No rash Extremity Pulses:  2+ radial Musculoskeletal: No deformity or edema  Neurologic: Upper and lower extremity motor 5/5 and symmetric  DATA:  Carotid Arterial Duplex Study  Patient Name:  Kyle English [facility]  Date of Exam:   11/09/2022 Medical Rec #: [id number]          Accession #:    [id number] Date of Birth: 1950/09/30          Patient Gender: M Patient Age:   60  years Exam Location:  [facility] Procedure:      VAS US CAROTID Referring Phys: Lemar Livings   --------------------------------------------------------------------------- -----   Indications:       Right CEA 04/23/16. Risk Factors:      Hypertension, hyperlipidemia, no history of smoking. Comparison Study:  11/15/21: Right 1-39% Left 1-39%  Performing Technologist: Thereasa Parkin RVT    Examination Guidelines: A complete evaluation includes B-mode imaging, spectral Doppler, color Doppler, and power Doppler as needed of all accessible portions of each vessel. Bilateral testing is considered an integral part of a complete examination. Limited examinations for reoccurring indications may be performed as noted.    Right Carotid Findings: +----------+--------+--------+--------+------------------+--------+           PSV cm/sEDV cm/sStenosisPlaque DescriptionComments +----------+--------+--------+--------+------------------+--------+ CCA Prox  102     20                                         +----------+--------+--------+--------+------------------+--------+ CCA Mid   91      24              heterogenous               +----------+--------+--------+--------+------------------+--------+ CCA Distal114     24              heterogenous               +----------+--------+--------+--------+------------------+--------+ ICA Prox  68      17      1-39%   heterogenous               +----------+--------+--------+--------+------------------+--------+ ICA Mid    93      29                                         +----------+--------+--------+--------+------------------+--------+ ICA Distal105     34                                         +----------+--------+--------+--------+------------------+--------+ ECA       136     23                                         +----------+--------+--------+--------+------------------+--------+  +----------+--------+-------+----------------+-------------------+           PSV cm/sEDV cmsDescribe        Arm Pressure (mmHG) +----------+--------+-------+----------------+-------------------+ [id number]      0      Multiphasic, [id number]                 +----------+--------+-------+----------------+-------------------+  +---------+--------+--+--------+--+---------+ VertebralPSV cm/s66EDV cm/s16Antegrade +---------+--------+--+--------+--+---------+     Left Carotid Findings: +----------+--------+--------+--------+-------------------------+--------+           PSV cm/sEDV cm/sStenosisPlaque Description       Comments +----------+--------+--------+--------+-------------------------+--------+ CCA Prox  99      22                                                +----------+--------+--------+--------+-------------------------+--------+  CCA Mid   104     25                                                +----------+--------+--------+--------+-------------------------+--------+ CCA Distal102     28              heterogenous and calcific         +----------+--------+--------+--------+-------------------------+--------+ ICA Prox  101     21      1-39%   heterogenous and calcific         +----------+--------+--------+--------+-------------------------+--------+ ICA Mid   78      23                                                +----------+--------+--------+--------+-------------------------+--------+ ICA Distal90      29                                                 +----------+--------+--------+--------+-------------------------+--------+ ECA       117     18                                                +----------+--------+--------+--------+-------------------------+--------+  +----------+--------+--------+----------------+-------------------+           PSV cm/sEDV cm/sDescribe        Arm Pressure (mmHG) +----------+--------+--------+----------------+-------------------+ [id number]      1       Multiphasic, [id number]                 +----------+--------+--------+----------------+-------------------+  +---------+--------+--+--------+--+---------+ VertebralPSV cm/s57EDV cm/s16Antegrade +---------+--------+--+--------+--+---------+     Summary: Right Carotid: Patent endarterectomy with no stenosis.  Left Carotid: Velocities in the left ICA are consistent with a 1-39% stenosis.  Vertebrals:  Bilateral vertebral arteries demonstrate antegrade flow. Subclavians: Normal flow hemodynamics were seen in bilateral subclavian              arteries.    ASSESSMENT/PLAN:  72 y/o male with Carotid stenosis s/p Right carotid endarterectomy .  He denies symptoms of stroke/TIA.  He stays busy, takes an ASA and Statin daily.  He states he does get dizzy on occasion if he does not drink enough water.    Recheck carotid duplex in 1 year per protocol      Mosetta Pigeon PA-C [facility] [place] Office: [phone]  MD in clinic [place]

## 2022-12-12 DIAGNOSIS — G4733 Obstructive sleep apnea (adult) (pediatric): Secondary | ICD-10-CM | POA: Diagnosis not present

## 2022-12-17 ENCOUNTER — Other Ambulatory Visit: Payer: Self-pay

## 2022-12-17 DIAGNOSIS — I6523 Occlusion and stenosis of bilateral carotid arteries: Secondary | ICD-10-CM

## 2022-12-31 DIAGNOSIS — G4733 Obstructive sleep apnea (adult) (pediatric): Secondary | ICD-10-CM | POA: Diagnosis not present

## 2023-02-14 DIAGNOSIS — G4733 Obstructive sleep apnea (adult) (pediatric): Secondary | ICD-10-CM | POA: Diagnosis not present

## 2023-02-14 DIAGNOSIS — R7309 Other abnormal glucose: Secondary | ICD-10-CM | POA: Diagnosis not present

## 2023-02-14 DIAGNOSIS — E782 Mixed hyperlipidemia: Secondary | ICD-10-CM | POA: Diagnosis not present

## 2023-02-14 DIAGNOSIS — I10 Essential (primary) hypertension: Secondary | ICD-10-CM | POA: Diagnosis not present

## 2023-02-14 DIAGNOSIS — Z9181 History of falling: Secondary | ICD-10-CM | POA: Diagnosis not present

## 2023-03-12 DIAGNOSIS — C61 Malignant neoplasm of prostate: Secondary | ICD-10-CM | POA: Diagnosis not present

## 2023-03-19 DIAGNOSIS — C61 Malignant neoplasm of prostate: Secondary | ICD-10-CM | POA: Diagnosis not present

## 2023-04-08 DIAGNOSIS — R310 Gross hematuria: Secondary | ICD-10-CM | POA: Diagnosis not present

## 2023-04-08 DIAGNOSIS — N3000 Acute cystitis without hematuria: Secondary | ICD-10-CM | POA: Diagnosis not present

## 2023-05-16 DIAGNOSIS — N390 Urinary tract infection, site not specified: Secondary | ICD-10-CM | POA: Diagnosis not present

## 2023-06-26 DIAGNOSIS — N390 Urinary tract infection, site not specified: Secondary | ICD-10-CM | POA: Diagnosis not present

## 2023-07-03 DIAGNOSIS — C61 Malignant neoplasm of prostate: Secondary | ICD-10-CM | POA: Diagnosis not present

## 2023-07-03 DIAGNOSIS — R310 Gross hematuria: Secondary | ICD-10-CM | POA: Diagnosis not present

## 2023-08-22 DIAGNOSIS — C61 Malignant neoplasm of prostate: Secondary | ICD-10-CM | POA: Diagnosis not present

## 2023-09-18 DIAGNOSIS — L814 Other melanin hyperpigmentation: Secondary | ICD-10-CM | POA: Diagnosis not present

## 2023-09-18 DIAGNOSIS — L570 Actinic keratosis: Secondary | ICD-10-CM | POA: Diagnosis not present

## 2023-09-18 DIAGNOSIS — D225 Melanocytic nevi of trunk: Secondary | ICD-10-CM | POA: Diagnosis not present

## 2023-09-18 DIAGNOSIS — L578 Other skin changes due to chronic exposure to nonionizing radiation: Secondary | ICD-10-CM | POA: Diagnosis not present

## 2023-09-18 DIAGNOSIS — D171 Benign lipomatous neoplasm of skin and subcutaneous tissue of trunk: Secondary | ICD-10-CM | POA: Diagnosis not present

## 2023-09-18 DIAGNOSIS — L821 Other seborrheic keratosis: Secondary | ICD-10-CM | POA: Diagnosis not present

## 2023-09-18 DIAGNOSIS — D2239 Melanocytic nevi of other parts of face: Secondary | ICD-10-CM | POA: Diagnosis not present

## 2023-09-18 DIAGNOSIS — D1801 Hemangioma of skin and subcutaneous tissue: Secondary | ICD-10-CM | POA: Diagnosis not present

## 2023-10-06 DIAGNOSIS — L0390 Cellulitis, unspecified: Secondary | ICD-10-CM | POA: Diagnosis not present

## 2023-10-11 DIAGNOSIS — L0390 Cellulitis, unspecified: Secondary | ICD-10-CM | POA: Diagnosis not present

## 2023-10-18 DIAGNOSIS — L0390 Cellulitis, unspecified: Secondary | ICD-10-CM | POA: Diagnosis not present

## 2023-10-18 DIAGNOSIS — L0291 Cutaneous abscess, unspecified: Secondary | ICD-10-CM | POA: Diagnosis not present

## 2023-12-18 DIAGNOSIS — J309 Allergic rhinitis, unspecified: Secondary | ICD-10-CM | POA: Diagnosis not present

## 2023-12-18 DIAGNOSIS — G4733 Obstructive sleep apnea (adult) (pediatric): Secondary | ICD-10-CM | POA: Diagnosis not present

## 2023-12-26 DIAGNOSIS — G4733 Obstructive sleep apnea (adult) (pediatric): Secondary | ICD-10-CM | POA: Diagnosis not present

## 2024-01-06 ENCOUNTER — Other Ambulatory Visit: Payer: Self-pay

## 2024-01-06 DIAGNOSIS — I6523 Occlusion and stenosis of bilateral carotid arteries: Secondary | ICD-10-CM

## 2024-01-22 ENCOUNTER — Encounter (HOSPITAL_COMMUNITY)

## 2024-01-22 ENCOUNTER — Ambulatory Visit

## 2024-01-25 DIAGNOSIS — N390 Urinary tract infection, site not specified: Secondary | ICD-10-CM | POA: Diagnosis not present

## 2024-01-25 DIAGNOSIS — R319 Hematuria, unspecified: Secondary | ICD-10-CM | POA: Diagnosis not present

## 2024-02-24 DIAGNOSIS — C61 Malignant neoplasm of prostate: Secondary | ICD-10-CM | POA: Diagnosis not present

## 2024-02-24 DIAGNOSIS — N3041 Irradiation cystitis with hematuria: Secondary | ICD-10-CM | POA: Diagnosis not present

## 2024-02-26 DIAGNOSIS — R609 Edema, unspecified: Secondary | ICD-10-CM | POA: Diagnosis not present

## 2024-02-26 DIAGNOSIS — R7309 Other abnormal glucose: Secondary | ICD-10-CM | POA: Diagnosis not present

## 2024-02-26 DIAGNOSIS — C61 Malignant neoplasm of prostate: Secondary | ICD-10-CM | POA: Diagnosis not present

## 2024-02-26 DIAGNOSIS — G4733 Obstructive sleep apnea (adult) (pediatric): Secondary | ICD-10-CM | POA: Diagnosis not present

## 2024-02-26 DIAGNOSIS — I10 Essential (primary) hypertension: Secondary | ICD-10-CM | POA: Diagnosis not present

## 2024-02-26 DIAGNOSIS — E782 Mixed hyperlipidemia: Secondary | ICD-10-CM | POA: Diagnosis not present

## 2024-02-27 ENCOUNTER — Other Ambulatory Visit: Payer: Self-pay | Admitting: Vascular Surgery

## 2024-02-27 DIAGNOSIS — I6523 Occlusion and stenosis of bilateral carotid arteries: Secondary | ICD-10-CM

## 2024-03-24 NOTE — Progress Notes (Unsigned)
 Office Note     CC:  follow up Requesting Provider:  Kip Righter, MD  HPI: Kyle English is a 73 y.o. (11-27-1950) male who presents for surveillance follow up of carotid artery stenosis. He has history of right CEA by Dr. Sheree in September of 2017 for asymptomatic high grade stenosis. He has no history of TIA or stroke.   Today he reports overall doing well. No concerns today. He says only thing he has been dealing with over past 6 months is recurrent UTI's. It is suspected that this is related to his prostate radiation. He otherwise denies any visual changes, slurred speech,facial drooping, unilateral upper or lower extremity weakness or numbness. Denies any pain when walking or rest in his legs. No tissue loss. He is medically managed on Aspirin  and statin.   Past Medical History:  Diagnosis Date   Arthritis    Carotid stenosis, bilateral followed by vascular-- dr sheree   s/p  right CEA 04-26-2016;  last carotid duplex 08-17-2019 in epic,  patent right CEA, left ICA 1-39%, bilateral ECA ,50%   Cluster headaches    cluster headaches--- takes Verapamil    Elevated LFTs    followed by pcp---   had abd. ultrasound in epic 12-22-2019   GERD (gastroesophageal reflux disease)    History of CEA (carotid endarterectomy) 04/23/2016   right side  ---- followed by vascular , dr sheree   History of kidney stones    Hypertension    followed by pcp   (12-24-2019 per pt never had a stress test)   Mixed hyperlipidemia    Nephrolithiasis    Nocturia    OSA on CPAP    Prostate cancer [facility]) urologist-- dr bell/  oncologist--- dr patrcia   dx 09-03-2019,  Stage T1c, Gleason 4+4   Vitamin D deficiency     Past Surgical History:  Procedure Laterality Date   ANTERIOR CERVICAL DECOMP/DISCECTOMY FUSION  11-19-2003  @[facility]    C5 -- 7   APPENDECTOMY  AGE 7   COLONOSCOPY  09/2019   CYSTOSCOPY/RETROGRADE/URETEROSCOPY/STONE EXTRACTION WITH BASKET  1990s   ENDARTERECTOMY Right 04/23/2016   Procedure:  RIGHT CAROTID ARTERY ENDARTERECTOMY;  Surgeon: Penne Lonni Sheree, MD;  Location: [facility] OR;  Service: Vascular;  Laterality: Right;   INGUINAL HERNIA REPAIR Right 10/05/2008   @[facility]    PATCH ANGIOPLASTY Right 04/23/2016   Procedure: WITH 1 cm x 6 cm XENOSURE PATCH ANGIOPLASTY;  Surgeon: Penne Lonni Sheree, MD;  Location: [facility] OR;  Service: Vascular;  Laterality: Right;   PROSTATE BIOPSY     RADIOACTIVE SEED IMPLANT N/A 12/30/2019   Procedure: RADIOACTIVE SEED IMPLANT/BRACHYTHERAPY IMPLANT;  Surgeon: Carolee Sherwood JONETTA DOUGLAS, MD;  Location: [facility];  Service: Urology;  Laterality: N/A;   SPACE OAR INSTILLATION N/A 12/30/2019   Procedure: SPACE OAR INSTILLATION;  Surgeon: Carolee Sherwood JONETTA DOUGLAS, MD;  Location: [facility];  Service: Urology;  Laterality: N/A;   UMBILICAL HERNIA REPAIR  11-06-2007  @[facility]     Social History   Socioeconomic History   Marital status: Married    Spouse name: Olam   Number of children: 3   Years of education: Not on file   Highest education level: Not on file  Occupational History   Not on file  Tobacco Use   Smoking status: Never    Passive exposure: Never   Smokeless tobacco: Never  Vaping Use   Vaping status: Never Used  Substance and Sexual Activity   Alcohol use: Not Currently  Comment: VERY RARE   Drug use: Never   Sexual activity: Not on file  Other Topics Concern   Not on file  Social History Narrative   Not on file   Social Drivers of Health   Financial Resource Strain: Not on file  Food Insecurity: Not on file  Transportation Needs: Not on file  Physical Activity: Not on file  Stress: Not on file  Social Connections: Not on file  Intimate Partner Violence: Not on file    Family History  Problem Relation Age of Onset   Diabetes Mother    Hypertension Mother    Prostate cancer Father        died at the age of 29   Hypertension Sister    CVA Maternal Grandmother 79   Cancer Maternal Uncle         Uncertain   Breast cancer Neg Hx    Colon cancer Neg Hx    Pancreatic cancer Neg Hx     Current Outpatient Medications  Medication Sig Dispense Refill   aspirin  81 MG tablet take 1 tablet by mouth once daily at bedtime     atorvastatin  (LIPITOR) 80 MG tablet Take 80 mg by mouth daily.     dutasteride  (AVODART ) 0.5 MG capsule Take 0.5 mg by mouth at bedtime.      Fish Oil-Cholecalciferol (FISH OIL + D3) 1200-1000 MG-UNIT CAPS Take 1 capsule by mouth daily.      fluticasone  (FLONASE ) 50 MCG/ACT nasal spray Place 2 sprays into both nostrils as needed.      Glucos-Chondroit-Hyaluron-MSM (GLUCOSAMINE CHONDROITIN JOINT) TABS Take 1 tablet by mouth in the morning and at bedtime.      Multiple Vitamins-Minerals (MULTI ADULT GUMMIES) CHEW Chew 2 tablets by mouth daily.      niacin 500 MG tablet Take 500 mg by mouth daily. Taking one tablet once in the morning.     omeprazole  (PRILOSEC  OTC) 20 MG tablet Take 20 mg by mouth daily.      sildenafil (REVATIO) 20 MG tablet Take 20 mg by mouth as needed (ED).      tamsulosin (FLOMAX) 0.4 MG CAPS capsule Take 0.4 mg by mouth daily.     verapamil  (CALAN -SR) 240 MG CR tablet Take 240 mg by mouth daily.      No current facility-administered medications for this visit.    No Known Allergies   REVIEW OF SYSTEMS:  Negative unless noted in HPI [X]  denotes positive finding, [ ]  denotes negative finding Cardiac  Comments:  Chest pain or chest pressure:    Shortness of breath upon exertion:    Short of breath when lying flat:    Irregular heart rhythm:        Vascular    Pain in calf, thigh, or hip brought on by ambulation:    Pain in feet at night that wakes you up from your sleep:     Blood clot in your veins:    Leg swelling:         Pulmonary    Oxygen at home:    Productive cough:     Wheezing:         Neurologic    Sudden weakness in arms or legs:     Sudden numbness in arms or legs:     Sudden onset of difficulty speaking or slurred  speech:    Temporary loss of vision in one eye:     Problems with dizziness:  Gastrointestinal    Blood in stool:     Vomited blood:         Genitourinary    Burning when urinating:     Blood in urine:        Psychiatric    Major depression:         Hematologic    Bleeding problems:    Problems with blood clotting too easily:        Skin    Rashes or ulcers:        Constitutional    Fever or chills:      PHYSICAL EXAMINATION:  Vitals:   03/25/24 1315 03/25/24 1317  BP: 120/72 127/72  Pulse: (!) 59   Temp: 97.7 F (36.5 C)   TempSrc: Temporal   Weight: 234 lb 8 oz (106.4 kg)     General:  WDWN in NAD; vital signs documented above Gait: Normal HENT: WNL, normocephalic Pulmonary: normal non-labored breathing Cardiac: regular HR; no carotid bruit Abdomen: soft Vascular Exam/Pulses: 2+ radial, 2+ DP pulses Extremities: without ischemic changes, without Gangrene , without cellulitis; without open wounds; bilateral lower extremity edema Musculoskeletal: no muscle wasting or atrophy  Neurologic: A&O X 3 Psychiatric:  The pt has Normal affect.   Non-Invasive Vascular Imaging:   VAS US  Carotid duplex: Summary:  Right Carotid: Velocities in the right ICA are consistent with a 1-39% stenosis. Non-hemodynamically significant plaque <50% noted in the CCA. The ECA appears <50% stenosed.   Left Carotid: Velocities in the left ICA are consistent with a 1-39% stenosis. Non-hemodynamically significant plaque <50% noted in the CCA. The ECA appears <50% stenosed.   Vertebrals:  Bilateral vertebral arteries demonstrate antegrade flow.  Subclavians: Normal flow hemodynamics were seen in bilateral subclavian arteries.    ASSESSMENT/PLAN:: 73 y.o. male here for follow up for carotid artery stenosis. He has history of right CEA by Dr. Sheree in September of 2017 for asymptomatic high grade stenosis. He has no history of TIA or stroke. He remains without any associated  symptoms. Duplex today shows 1-39% stenosis bilaterally. Normal flow in the vertebral and subclavian arteries - continue Aspirin  and statin - Follow up in 1 year with repeat carotid duplex   Teretha Damme, PA-C [facility] [phone]  Clinic MD:   Sheree

## 2024-03-25 ENCOUNTER — Ambulatory Visit (HOSPITAL_COMMUNITY)
Admission: RE | Admit: 2024-03-25 | Discharge: 2024-03-25 | Disposition: A | Discharge status: Home / Self Care | Source: Ambulatory Visit | Attending: Vascular Surgery | Admitting: Vascular Surgery

## 2024-03-25 ENCOUNTER — Ambulatory Visit: Attending: Vascular Surgery | Admitting: Physician Assistant

## 2024-03-25 VITALS — BP 127/72 | HR 59 | Temp 97.7°F | Wt 234.5 lb

## 2024-03-25 DIAGNOSIS — I6523 Occlusion and stenosis of bilateral carotid arteries: Secondary | ICD-10-CM | POA: Diagnosis not present

## 2024-03-25 DIAGNOSIS — Z9889 Other specified postprocedural states: Secondary | ICD-10-CM

## 2024-03-27 DIAGNOSIS — G4733 Obstructive sleep apnea (adult) (pediatric): Secondary | ICD-10-CM | POA: Diagnosis not present

## 2024-05-12 ENCOUNTER — Ambulatory Visit (HOSPITAL_BASED_OUTPATIENT_CLINIC_OR_DEPARTMENT_OTHER): Admission: EM | Admit: 2024-05-12 | Discharge: 2024-05-12 | Disposition: A | Discharge status: Home / Self Care

## 2024-05-12 ENCOUNTER — Emergency Department (HOSPITAL_COMMUNITY): Admitting: Anesthesiology

## 2024-05-12 ENCOUNTER — Encounter (HOSPITAL_BASED_OUTPATIENT_CLINIC_OR_DEPARTMENT_OTHER): Payer: Self-pay

## 2024-05-12 ENCOUNTER — Other Ambulatory Visit: Payer: Self-pay

## 2024-05-12 ENCOUNTER — Other Ambulatory Visit (HOSPITAL_COMMUNITY): Payer: Self-pay

## 2024-05-12 ENCOUNTER — Emergency Department (HOSPITAL_BASED_OUTPATIENT_CLINIC_OR_DEPARTMENT_OTHER)

## 2024-05-12 ENCOUNTER — Encounter (HOSPITAL_COMMUNITY): Admission: EM | Disposition: A | Discharge status: Home / Self Care | Payer: Self-pay | Source: Home / Self Care

## 2024-05-12 DIAGNOSIS — N281 Cyst of kidney, acquired: Secondary | ICD-10-CM | POA: Insufficient documentation

## 2024-05-12 DIAGNOSIS — K8020 Calculus of gallbladder without cholecystitis without obstruction: Secondary | ICD-10-CM | POA: Diagnosis not present

## 2024-05-12 DIAGNOSIS — N3289 Other specified disorders of bladder: Secondary | ICD-10-CM | POA: Insufficient documentation

## 2024-05-12 DIAGNOSIS — D72829 Elevated white blood cell count, unspecified: Secondary | ICD-10-CM

## 2024-05-12 DIAGNOSIS — N200 Calculus of kidney: Secondary | ICD-10-CM | POA: Insufficient documentation

## 2024-05-12 DIAGNOSIS — Z87442 Personal history of urinary calculi: Secondary | ICD-10-CM | POA: Insufficient documentation

## 2024-05-12 DIAGNOSIS — K824 Cholesterolosis of gallbladder: Secondary | ICD-10-CM | POA: Diagnosis not present

## 2024-05-12 DIAGNOSIS — Z79899 Other long term (current) drug therapy: Secondary | ICD-10-CM | POA: Insufficient documentation

## 2024-05-12 DIAGNOSIS — R1011 Right upper quadrant pain: Secondary | ICD-10-CM

## 2024-05-12 DIAGNOSIS — Z7982 Long term (current) use of aspirin: Secondary | ICD-10-CM | POA: Insufficient documentation

## 2024-05-12 DIAGNOSIS — K8012 Calculus of gallbladder with acute and chronic cholecystitis without obstruction: Secondary | ICD-10-CM | POA: Insufficient documentation

## 2024-05-12 DIAGNOSIS — K819 Cholecystitis, unspecified: Secondary | ICD-10-CM

## 2024-05-12 DIAGNOSIS — K8013 Calculus of gallbladder with acute and chronic cholecystitis with obstruction: Secondary | ICD-10-CM | POA: Diagnosis not present

## 2024-05-12 DIAGNOSIS — I2510 Atherosclerotic heart disease of native coronary artery without angina pectoris: Secondary | ICD-10-CM | POA: Insufficient documentation

## 2024-05-12 DIAGNOSIS — K810 Acute cholecystitis: Secondary | ICD-10-CM | POA: Diagnosis not present

## 2024-05-12 DIAGNOSIS — G4733 Obstructive sleep apnea (adult) (pediatric): Secondary | ICD-10-CM | POA: Diagnosis not present

## 2024-05-12 HISTORY — PX: CHOLECYSTECTOMY: SHX55

## 2024-05-12 LAB — COMPREHENSIVE METABOLIC PANEL WITH GFR
ALT: 26 U/L (ref 0–44)
AST: 24 U/L (ref 15–41)
Albumin: 4.5 g/dL (ref 3.5–5.0)
Alkaline Phosphatase: 89 U/L (ref 38–126)
Anion gap: 16 — ABNORMAL HIGH (ref 5–15)
BUN: 15 mg/dL (ref 8–23)
CO2: 21 mmol/L — ABNORMAL LOW (ref 22–32)
Calcium: 9.2 mg/dL (ref 8.9–10.3)
Chloride: 101 mmol/L (ref 98–111)
Creatinine, Ser: 1.06 mg/dL (ref 0.61–1.24)
GFR, Estimated: >60 mL/min (ref >60)
Glucose, Bld: 140 mg/dL — ABNORMAL HIGH (ref 70–99)
Potassium: 4.1 mmol/L (ref 3.5–5.1)
Sodium: 138 mmol/L (ref 135–145)
Total Bilirubin: 0.6 mg/dL (ref 0.0–1.2)
Total Protein: 7.2 g/dL (ref 6.5–8.1)

## 2024-05-12 LAB — URINALYSIS, MICROSCOPIC (REFLEX)

## 2024-05-12 LAB — URINALYSIS, ROUTINE W REFLEX MICROSCOPIC
Bilirubin Urine: NEGATIVE
Glucose, UA: NEGATIVE mg/dL
Hgb urine dipstick: NEGATIVE
Ketones, ur: NEGATIVE mg/dL
Leukocytes,Ua: NEGATIVE
Nitrite: NEGATIVE
Protein, ur: 30 mg/dL — AB
Specific Gravity, Urine: 1.025 (ref 1.005–1.030)
pH: 6.5 (ref 5.0–8.0)

## 2024-05-12 LAB — CBC
HCT: 39.8 % (ref 39.0–52.0)
Hemoglobin: 13.8 g/dL (ref 13.0–17.0)
MCH: 31.7 pg (ref 26.0–34.0)
MCHC: 34.7 g/dL (ref 30.0–36.0)
MCV: 91.5 fL (ref 80.0–100.0)
Platelets: 165 K/uL (ref 150–400)
RBC: 4.35 MIL/uL (ref 4.22–5.81)
RDW: 13.6 % (ref 11.5–15.5)
WBC: 18.8 K/uL — ABNORMAL HIGH (ref 4.0–10.5)
nRBC: 0 % (ref 0.0–0.2)

## 2024-05-12 LAB — LIPASE, BLOOD: Lipase: 38 U/L (ref 11–51)

## 2024-05-12 SURGERY — LAPAROSCOPIC CHOLECYSTECTOMY
Anesthesia: General

## 2024-05-12 MED ORDER — SODIUM CHLORIDE 0.9 % IV SOLN
1.0000 g | Freq: Once | INTRAVENOUS | Status: AC
  Administered 2024-05-12: 1 g via INTRAVENOUS
  Filled 2024-05-12: qty 10

## 2024-05-12 MED ORDER — IOHEXOL 300 MG/ML  SOLN
100.0000 mL | Freq: Once | INTRAMUSCULAR | Status: AC | PRN
  Administered 2024-05-12: 100 mL via INTRAVENOUS

## 2024-05-12 MED ORDER — BUPIVACAINE-EPINEPHRINE (PF) 0.25% -1:200000 IJ SOLN
INTRAMUSCULAR | Status: AC
  Filled 2024-05-12: qty 30

## 2024-05-12 MED ORDER — SUGAMMADEX SODIUM 200 MG/2ML IV SOLN
INTRAVENOUS | Status: DC | PRN
  Administered 2024-05-12: 200 mg via INTRAVENOUS

## 2024-05-12 MED ORDER — ACETAMINOPHEN 500 MG PO TABS
1000.0000 mg | ORAL_TABLET | ORAL | Status: AC
  Administered 2024-05-12: 1000 mg via ORAL
  Filled 2024-05-12: qty 2

## 2024-05-12 MED ORDER — SODIUM CHLORIDE 0.9 % IV SOLN: 250.0000 mL | INTRAVENOUS | Status: DC | PRN

## 2024-05-12 MED ORDER — DROPERIDOL 2.5 MG/ML IJ SOLN: 0.6250 mg | Freq: Once | INTRAMUSCULAR | Status: DC | PRN

## 2024-05-12 MED ORDER — ONDANSETRON HCL 4 MG/2ML IJ SOLN
INTRAMUSCULAR | Status: AC
  Filled 2024-05-12: qty 2

## 2024-05-12 MED ORDER — OXYCODONE HCL 5 MG/5ML PO SOLN: 5.0000 mg | Freq: Once | ORAL | Status: AC | PRN

## 2024-05-12 MED ORDER — CHLORHEXIDINE GLUCONATE 0.12 % MT SOLN
15.0000 mL | Freq: Once | OROMUCOSAL | Status: AC
  Administered 2024-05-12: 15 mL via OROMUCOSAL

## 2024-05-12 MED ORDER — LACTATED RINGERS IR SOLN
Status: DC | PRN
  Administered 2024-05-12: 2000 mL

## 2024-05-12 MED ORDER — MORPHINE SULFATE (PF) 2 MG/ML IV SOLN: 1.0000 mg | INTRAVENOUS | Status: DC | PRN

## 2024-05-12 MED ORDER — ROCURONIUM BROMIDE 10 MG/ML (PF) SYRINGE
PREFILLED_SYRINGE | INTRAVENOUS | Status: DC | PRN
  Administered 2024-05-12: 50 mg via INTRAVENOUS
  Administered 2024-05-12: 10 mg via INTRAVENOUS

## 2024-05-12 MED ORDER — INDOCYANINE GREEN 25 MG IV SOLR
2.5000 mg | Freq: Once | INTRAVENOUS | Status: AC
  Administered 2024-05-12: 2.5 mg via INTRAVENOUS

## 2024-05-12 MED ORDER — ONDANSETRON HCL 4 MG/2ML IJ SOLN
INTRAMUSCULAR | Status: DC | PRN
  Administered 2024-05-12: 4 mg via INTRAVENOUS

## 2024-05-12 MED ORDER — LIDOCAINE HCL (PF) 2 % IJ SOLN
INTRAMUSCULAR | Status: AC
  Filled 2024-05-12: qty 5

## 2024-05-12 MED ORDER — METRONIDAZOLE 500 MG/100ML IV SOLN
500.0000 mg | Freq: Once | INTRAVENOUS | Status: AC
  Administered 2024-05-12: 500 mg via INTRAVENOUS
  Filled 2024-05-12: qty 100

## 2024-05-12 MED ORDER — ACETAMINOPHEN 325 MG PO TABS: 650.0000 mg | ORAL_TABLET | ORAL | Status: DC | PRN

## 2024-05-12 MED ORDER — SUCCINYLCHOLINE CHLORIDE 200 MG/10ML IV SOSY
PREFILLED_SYRINGE | INTRAVENOUS | Status: AC
  Filled 2024-05-12: qty 10

## 2024-05-12 MED ORDER — CEFTRIAXONE SODIUM 1 G IJ SOLR
1.0000 g | Freq: Once | INTRAMUSCULAR | Status: AC
  Administered 2024-05-12: 1 g via INTRAVENOUS
  Filled 2024-05-12: qty 10

## 2024-05-12 MED ORDER — FENTANYL CITRATE (PF) 100 MCG/2ML IJ SOLN
INTRAMUSCULAR | Status: AC
  Filled 2024-05-12: qty 2

## 2024-05-12 MED ORDER — ROCURONIUM BROMIDE 10 MG/ML (PF) SYRINGE
PREFILLED_SYRINGE | INTRAVENOUS | Status: AC
  Filled 2024-05-12: qty 10

## 2024-05-12 MED ORDER — ACETAMINOPHEN 325 MG PO TABS
650.0000 mg | ORAL_TABLET | Freq: Four times a day (QID) | ORAL | 0 refills | Status: AC
  Filled 2024-05-12: qty 50, 7d supply, fill #0

## 2024-05-12 MED ORDER — OXYCODONE HCL 5 MG PO TABS
5.0000 mg | ORAL_TABLET | Freq: Three times a day (TID) | ORAL | 0 refills | Status: AC | PRN
  Filled 2024-05-12: qty 8, 3d supply, fill #0

## 2024-05-12 MED ORDER — SODIUM CHLORIDE 0.9% FLUSH: 3.0000 mL | INTRAVENOUS | Status: DC | PRN

## 2024-05-12 MED ORDER — CHLORHEXIDINE GLUCONATE CLOTH 2 % EX PADS: 6.0000 | MEDICATED_PAD | Freq: Once | CUTANEOUS | Status: DC

## 2024-05-12 MED ORDER — LIDOCAINE HCL (PF) 2 % IJ SOLN
INTRAMUSCULAR | Status: DC | PRN
  Administered 2024-05-12: 100 mg via INTRADERMAL

## 2024-05-12 MED ORDER — SODIUM CHLORIDE 0.9 % IV SOLN
Freq: Once | INTRAVENOUS | Status: AC
Start: 2024-05-12 — End: 2024-05-12

## 2024-05-12 MED ORDER — DEXAMETHASONE SOD PHOSPHATE PF 10 MG/ML IJ SOLN
INTRAMUSCULAR | Status: DC | PRN
  Administered 2024-05-12: 6 mg via INTRAVENOUS

## 2024-05-12 MED ORDER — SODIUM CHLORIDE 0.9% FLUSH: 3.0000 mL | Freq: Two times a day (BID) | INTRAVENOUS | Status: DC

## 2024-05-12 MED ORDER — FENTANYL CITRATE (PF) 50 MCG/ML IJ SOSY: 25.0000 ug | PREFILLED_SYRINGE | INTRAMUSCULAR | Status: DC | PRN

## 2024-05-12 MED ORDER — IBUPROFEN 200 MG PO TABS
600.0000 mg | ORAL_TABLET | Freq: Four times a day (QID) | ORAL | 0 refills | Status: AC
  Filled 2024-05-12: qty 100, 9d supply, fill #0

## 2024-05-12 MED ORDER — PHENYLEPHRINE 80 MCG/ML (10ML) SYRINGE FOR IV PUSH (FOR BLOOD PRESSURE SUPPORT)
PREFILLED_SYRINGE | INTRAVENOUS | Status: AC
  Filled 2024-05-12: qty 10

## 2024-05-12 MED ORDER — SUGAMMADEX SODIUM 200 MG/2ML IV SOLN
INTRAVENOUS | Status: AC
  Filled 2024-05-12: qty 2

## 2024-05-12 MED ORDER — PROPOFOL 10 MG/ML IV BOLUS
INTRAVENOUS | Status: AC
  Filled 2024-05-12: qty 20

## 2024-05-12 MED ORDER — GABAPENTIN 300 MG PO CAPS
300.0000 mg | ORAL_CAPSULE | ORAL | Status: AC
  Administered 2024-05-12: 300 mg via ORAL
  Filled 2024-05-12: qty 1

## 2024-05-12 MED ORDER — HYDROMORPHONE HCL 1 MG/ML IJ SOLN
0.5000 mg | Freq: Once | INTRAMUSCULAR | Status: AC
  Administered 2024-05-12: 0.5 mg via INTRAVENOUS
  Filled 2024-05-12: qty 1

## 2024-05-12 MED ORDER — OXYCODONE HCL 5 MG PO TABS: 5.0000 mg | ORAL_TABLET | ORAL | Status: DC | PRN

## 2024-05-12 MED ORDER — BUPIVACAINE-EPINEPHRINE 0.25% -1:200000 IJ SOLN
INTRAMUSCULAR | Status: DC | PRN
  Administered 2024-05-12: 30 mL

## 2024-05-12 MED ORDER — SODIUM CHLORIDE 0.9 % IV BOLUS
1000.0000 mL | Freq: Once | INTRAVENOUS | Status: AC
  Administered 2024-05-12: 1000 mL via INTRAVENOUS

## 2024-05-12 MED ORDER — OXYCODONE HCL 5 MG PO TABS
ORAL_TABLET | ORAL | Status: AC
  Filled 2024-05-12: qty 1

## 2024-05-12 MED ORDER — PROPOFOL 500 MG/50ML IV EMUL
INTRAVENOUS | Status: DC | PRN
  Administered 2024-05-12: 150 mg via INTRAVENOUS
  Administered 2024-05-12: 20 mg via INTRAVENOUS

## 2024-05-12 MED ORDER — ONDANSETRON HCL 4 MG/2ML IJ SOLN
4.0000 mg | Freq: Once | INTRAMUSCULAR | Status: AC
Start: 2024-05-12 — End: 2024-05-12
  Administered 2024-05-12: 4 mg via INTRAVENOUS
  Filled 2024-05-12: qty 2

## 2024-05-12 MED ORDER — FENTANYL CITRATE (PF) 100 MCG/2ML IJ SOLN
INTRAMUSCULAR | Status: DC | PRN
  Administered 2024-05-12: 25 ug via INTRAVENOUS
  Administered 2024-05-12: 75 ug via INTRAVENOUS

## 2024-05-12 MED ORDER — LACTATED RINGERS IV SOLN: INTRAVENOUS | Status: DC

## 2024-05-12 MED ORDER — OXYCODONE HCL 5 MG PO TABS
5.0000 mg | ORAL_TABLET | Freq: Once | ORAL | Status: AC | PRN
  Administered 2024-05-12: 5 mg via ORAL

## 2024-05-12 MED ORDER — ACETAMINOPHEN 650 MG RE SUPP: 650.0000 mg | RECTAL | Status: DC | PRN

## 2024-05-12 SURGICAL SUPPLY — 33 items
CABLE HIGH FREQUENCY MONO STRZ (ELECTRODE) IMPLANT
CATH CHOLANG 76X19 KUMAR (CATHETERS) IMPLANT
CHLORAPREP W/TINT 26 (MISCELLANEOUS) ×1 IMPLANT
CLIP APPLIE 5 13 M/L LIGAMAX5 (MISCELLANEOUS) IMPLANT
CLIP APPLIE ROT 10 11.4 M/L (STAPLE) IMPLANT
COVER MAYO STAND XLG (MISCELLANEOUS) IMPLANT
COVER SURGICAL LIGHT HANDLE (MISCELLANEOUS) ×1 IMPLANT
DERMABOND ADVANCED .7 DNX12 (GAUZE/BANDAGES/DRESSINGS) ×1 IMPLANT
DRAPE C-ARM 42X120 X-RAY (DRAPES) IMPLANT
ELECT PENCIL ROCKER SW 15FT (MISCELLANEOUS) ×1 IMPLANT
ELECT REM PT RETURN 15FT ADLT (MISCELLANEOUS) ×1 IMPLANT
ENDOLOOP SUT PDS II 0 18 (SUTURE) ×1 IMPLANT
GLOVE BIO SURGEON STRL SZ7 (GLOVE) ×1 IMPLANT
GOWN STRL REUS W/ TWL XL LVL3 (GOWN DISPOSABLE) ×1 IMPLANT
GRASPER SUT TROCAR 14GX15 (MISCELLANEOUS) IMPLANT
HEMOSTAT SNOW SURGICEL 2X4 (HEMOSTASIS) IMPLANT
IRRIGATION SUCT STRKRFLW 2 WTP (MISCELLANEOUS) IMPLANT
KIT BASIN OR (CUSTOM PROCEDURE TRAY) ×1 IMPLANT
KIT TURNOVER KIT A (KITS) ×1 IMPLANT
LHOOK LAP DISP 36CM (ELECTROSURGICAL) ×1 IMPLANT
NDL INSUFFLATION 14GA 120MM (NEEDLE) ×1 IMPLANT
NEEDLE INSUFFLATION 14GA 120MM (NEEDLE) ×1 IMPLANT
POUCH RETRIEVAL ECOSAC 10 (ENDOMECHANICALS) ×1 IMPLANT
SCISSORS LAP 5X35 DISP (ENDOMECHANICALS) ×1 IMPLANT
SET TUBE SMOKE EVAC HIGH FLOW (TUBING) ×1 IMPLANT
SLEEVE Z-THREAD 5X100MM (TROCAR) ×2 IMPLANT
SPIKE FLUID TRANSFER (MISCELLANEOUS) ×1 IMPLANT
SUT MNCRL AB 4-0 PS2 18 (SUTURE) ×1 IMPLANT
SUT VICRYL 0 UR6 27IN ABS (SUTURE) ×1 IMPLANT
TOWEL OR 17X26 10 PK STRL BLUE (TOWEL DISPOSABLE) ×1 IMPLANT
TRAY LAPAROSCOPIC (CUSTOM PROCEDURE TRAY) ×1 IMPLANT
TROCAR ADV FIXATION 12X100MM (TROCAR) ×1 IMPLANT
TROCAR Z-THREAD OPTICAL 5X100M (TROCAR) ×1 IMPLANT

## 2024-05-12 NOTE — Anesthesia Postprocedure Evaluation (Signed)
 Anesthesia Post Note  Patient: Kyle English [facility]  Procedure(s) Performed: LAPAROSCOPIC CHOLECYSTECTOMY     Patient location during evaluation: PACU Anesthesia Type: General Level of consciousness: awake and alert Pain management: pain level controlled Vital Signs Assessment: post-procedure vital signs reviewed and stable Respiratory status: spontaneous breathing, nonlabored ventilation, respiratory function stable and patient connected to nasal cannula oxygen Cardiovascular status: blood pressure returned to baseline and stable Postop Assessment: no apparent nausea or vomiting Anesthetic complications: no   No notable events documented.  Last Vitals:  Vitals:   05/12/24 1522 05/12/24 1530  BP: (!) 163/76   Pulse: 92 80  Resp: (!) 24 19  Temp: 37 C   SpO2: 98% 99%    Last Pain:  Vitals:   05/12/24 1530  TempSrc:   PainSc: 0-No pain                 Rome Ade

## 2024-05-12 NOTE — ED Notes (Addendum)
 Error entry

## 2024-05-12 NOTE — Op Note (Signed)
 Patient: Kyle English MRN: [id number] DOB: [birth year] Sex: male Operation/Procedure Date: 05/12/2024 Surgeons and Role:    * Deejay Koppelman, Cordella LABOR, MD - Primary Pre-operative Diagnoses: CHOLECYSTITIS Postoperative Diagnoses: CHOLECYSTITIS  Procedure performed: Procedures:   * LAPAROSCOPIC CHOLECYSTECTOMY  Anesthesia: General endotracheal anesthesia  Indications: Kyle English is a 73 year old male who presented to the ED with abdominal pain and nausea. He was evaluated and determined to have acute cholecystitis. I recommended cholecystectomy.  Preoperatively, I discussed in detail the risks, benefits, alternatives, and potential complications. The patient understands and requests to proceed.  Operative Findings: Significant inflammation consistent with acute cholecystitis.  Operative Narrative: The patient was positively identified and was taken to the operating room and placed supine on the operating table. A time-out was performed confirming correct patient and procedure. We also confirmed initiation of deep venous thrombosis prophylaxis and wound prophylaxis. After successful induction of general endotracheal anesthesia, the arms were carefully padded. An orogastric tube and footboard were placed. The abdomen was prepped and draped in the usual sterile surgical fashion.  We began our peritoneal access with a veress needle inserted at Palmer's point.  After aspiration showed return of air bubbles and there was a positive saline drop test, the insufflation was connected and the abdomen brought to a pressure of .  We then used an opti-view technique to place a 5mm port just to the right of midline, superior to the umbilicus.  A laparoscope was introduced into the abdomen, and there were no signs of injury from entry.  A 12mm port was placed in the subxiphoid position.  Two additional 5mm ports were placed in the RUQ.  A 360-degree visualization with a 30-degree 5-mm laparoscope  revealed grossly normal intra-abdominal contents.   The patient was placed in the head up position and tilted slightly to the left. There was significant inflammation, making it difficult to grasp the gallbladder.  A needle was used to aspirate the contents of the gallbladder.  The dome of the gallbladder was then grasped, elevated, and retracted anteriorly and cephalad.  The infundibulum was retracted laterally and inferiorly exposing Calot's triangle. The investing visceral peritoneal attachments overlying the infundibulum of the gallbladder were incised using the hook electrocautery and dissected free from the gallbladder itself. We soon developed two structures into the gallbladder consistent with the cystic duct and cystic artery. The loose areolar tissue around these structures was dissected free. The gallbladder was separated from the gallbladder fossa for approximately a third the distance up from the cystic plate, establishing the critical view of safety. We then transitioned to infrared viewing mode to allow for visualization of the ICG tracer. There was tracer throughout the liver. There was no tracer within the gallbladder given that the contents had been aspirated. There was tracer with a structure medial to the candidate cystic duct, consistent with where the common bile duct would be expected.  There was no tracer within the candidate cystic artery.  There was tracer within the duodenum, indicating no presence of a biliary obstruction. We returned to normal viewing mode. The cystic duct and cystic artery were triply clipped. These were divided using laparoscopic scissors leaving a single clip on the removal side. The gallbladder was elevated off the gallbladder fossa using the hook Bovie. The gallbladder was then exteriorized through the subxiphoid port site using an EndoCatch bag. We reestablished pneumoperitoneum and confirmed no leakage of blood or bile. We confirmed integrity of our clips. The  subhepatic space was irrigated with  warm sterile saline and suctioned free. The 12mm port site was closed using an 0-vicryl on a suture passer.We placed 0.25% Marcaine with epinephrine at each incision site for local anesthesia. The skin was closed using 4-0 Monocryl subcuticular suture. Dermabond was applied. The patient tolerated the procedure well, was extubated, and taken to the recovery room.  Estimated Blood Loss: 25mL Specimens: Gallbladder Implants: None Drains: None Complications: None Condition of the patient: Good, extubated Disposition: PACU  Cordella DELENA Idler Date: 05/12/2024 Time: 3:17 PM

## 2024-05-12 NOTE — Anesthesia Preprocedure Evaluation (Signed)
 Anesthesia Evaluation  Patient identified by MRN, date of birth, ID band Patient awake    Reviewed: Allergy & Precautions, NPO status , Patient's Chart, lab work & pertinent test results  History of Anesthesia Complications Negative for: history of anesthetic complications  Airway Mallampati: II  TM Distance: >3 FB Neck ROM: Full    Dental no notable dental hx. (+) Teeth Intact   Pulmonary sleep apnea and Continuous Positive Airway Pressure Ventilation , neg COPD, Patient abstained from smoking.Not current smoker   Pulmonary exam normal breath sounds clear to auscultation       Cardiovascular Exercise Tolerance: Good METShypertension, Pt. on medications + Peripheral Vascular Disease  (-) CAD and (-) Past MI (-) dysrhythmias  Rhythm:Regular Rate:Normal - Systolic murmurs    Neuro/Psych  Headaches  negative psych ROS   GI/Hepatic ,GERD  ,,(+)     (-) substance abuse    Endo/Other  neg diabetes    Renal/GU negative Renal ROS     Musculoskeletal   Abdominal  (+)  Abdomen: tender.   Peds  Hematology   Anesthesia Other Findings Past Medical History: No date: Arthritis followed by vascular-- dr sheree: Carotid stenosis, bilateral     Comment:  s/p  right CEA 04-26-2016;  last carotid duplex               08-17-2019 in epic,  patent right CEA, left ICA 1-39%,               bilateral ECA ,50% No date: Cluster headaches     Comment:  cluster headaches--- takes Verapamil  No date: Elevated LFTs     Comment:  followed by pcp---   had abd. ultrasound in epic               12-22-2019 No date: GERD (gastroesophageal reflux disease) 04/23/2016: History of CEA (carotid endarterectomy)     Comment:  right side  ---- followed by vascular , dr sheree No date: History of kidney stones No date: Hypertension     Comment:  followed by pcp   (12-24-2019 per pt never had a stress               test) No date: Mixed  hyperlipidemia No date: Nephrolithiasis No date: Nocturia No date: OSA on CPAP urologist-- dr bell/  oncologist--- dr patrcia: Prostate cancer [facility])     Comment:  dx 09-03-2019,  Stage T1c, Gleason 4+4 No date: Vitamin D deficiency  Reproductive/Obstetrics                              Anesthesia Physical Anesthesia Plan  ASA: 2  Anesthesia Plan: General   Post-op Pain Management: Tylenol  PO (pre-op)* and Gabapentin PO (pre-op)*   Induction: Intravenous  PONV Risk Score and Plan: 3 and Ondansetron , Dexamethasone  and Midazolam   Airway Management Planned: Oral ETT  Additional Equipment: None  Intra-op Plan:   Post-operative Plan: Extubation in OR  Informed Consent: I have reviewed the patients History and Physical, chart, labs and discussed the procedure including the risks, benefits and alternatives for the proposed anesthesia with the patient or authorized representative who has indicated his/her understanding and acceptance.     Dental advisory given  Plan Discussed with: CRNA and Surgeon  Anesthesia Plan Comments: (Discussed risks of anesthesia with patient, including PONV, sore throat, lip/dental/eye damage. Rare risks discussed as well, such as cardiorespiratory and neurological sequelae, and allergic reactions. Discussed the role of  CRNA in patient's perioperative care. Patient understands.)        Anesthesia Quick Evaluation

## 2024-05-12 NOTE — ED Provider Notes (Addendum)
 [place] [facility] AT [facility] Provider Note   CSN: [id number] Arrival date & time: 05/12/24  9287     Patient presents with: Abdominal Pain   Kyle English is a 73 y.o. male.    Abdominal Pain  Patient presents because of right side abdominal pain.  Patient states started with right-sided abdominal pain right upper quadrant yesterday around 5 PM.  Subsequently had ongoing pain in this area.  Some nausea but no vomiting.  No fever no chills.  Bowel movements are been regular.  Previous surgeries include appendectomy.  Has never had pain in this area before.  Denies any chest pain or shortness of breath.  No pleuritic chest pain.     Previous medical history reviewed : Follows up with vascular surgery in setting of history of coronary artery stenosis.   Prior to Admission medications   Medication Sig Start Date End Date Taking? Authorizing Provider  aspirin  81 MG tablet take 1 tablet by mouth once daily at bedtime    [provider]  atorvastatin  (LIPITOR) 80 MG tablet Take 80 mg by mouth daily. 08/02/20   [provider]  dutasteride  (AVODART ) 0.5 MG capsule Take 0.5 mg by mouth at bedtime.  03/28/15   [provider]  Fish Oil-Cholecalciferol (FISH OIL + D3) 1200-1000 MG-UNIT CAPS Take 1 capsule by mouth daily.     [provider]  fluticasone  (FLONASE ) 50 MCG/ACT nasal spray Place 2 sprays into both nostrils as needed.  09/10/13   [provider]  Glucos-Chondroit-Hyaluron-MSM (GLUCOSAMINE CHONDROITIN JOINT) TABS Take 1 tablet by mouth in the morning and at bedtime.     [provider]  Multiple Vitamins-Minerals (MULTI ADULT GUMMIES) CHEW Chew 2 tablets by mouth daily.     [provider]  niacin 500 MG tablet Take 500 mg by mouth daily. Taking one tablet once in the morning.    [provider]  omeprazole  (PRILOSEC  OTC) 20 MG tablet Take 20 mg by mouth daily.     [provider]  sildenafil (REVATIO) 20 MG tablet Take 20 mg by mouth as needed (ED).  03/17/14   [provider]  tamsulosin (FLOMAX) 0.4 MG CAPS capsule Take 0.4 mg by mouth daily. 03/30/20   [provider]  verapamil  (CALAN -SR) 240 MG CR tablet Take 240 mg by mouth daily.  01/01/13   [provider]    Allergies: Patient has no known allergies.    Review of Systems  Gastrointestinal:  Positive for abdominal pain.    Updated Vital Signs BP (!) 154/57   Pulse 70   Temp 99.7 F (37.6 C) (Oral)   Resp 18   SpO2 97%   Physical Exam Vitals and nursing note reviewed.  Constitutional:      General: He is not in acute distress.    Appearance: He is well-developed.  HENT:     Head: Normocephalic and atraumatic.  Eyes:     Conjunctiva/sclera: Conjunctivae normal.  Cardiovascular:     Rate and Rhythm: Normal rate and regular rhythm.     Heart sounds: No murmur heard. Pulmonary:     Effort: Pulmonary effort is normal. No respiratory distress.     Breath sounds: Normal breath sounds.  Abdominal:     Palpations: Abdomen is soft.     Tenderness: There is no abdominal tenderness.   Musculoskeletal:        General: No swelling.     Cervical back: Neck supple.  Skin:    General: Skin is warm and dry.     Capillary Refill: Capillary refill takes less than 2 seconds.  Neurological:     Mental Status: He is alert.  Psychiatric:        Mood and Affect: Mood normal.     (all labs ordered are listed, but only abnormal results are displayed) Labs Reviewed  COMPREHENSIVE METABOLIC PANEL WITH GFR - Abnormal; Notable for the following components:      Result Value   CO2 21 (*)    Glucose, Bld 140 (*)    Anion gap 16 (*)    All other components within normal limits  CBC - Abnormal; Notable for the following components:   WBC 18.8 (*)    All other components within normal limits  URINALYSIS, ROUTINE W REFLEX MICROSCOPIC - Abnormal; Notable for the following  components:   Protein, ur 30 (*)    All other components within normal limits  URINALYSIS, MICROSCOPIC (REFLEX) - Abnormal; Notable for the following components:   Bacteria, UA RARE (*)    All other components within normal limits  LIPASE, BLOOD    EKG: EKG Interpretation Date/Time:  Tuesday May 12 2024 08:11:00 EDT Ventricular Rate:  75 PR Interval:  190 QRS Duration:  96 QT Interval:  413 QTC Calculation: 462 R Axis:   46  Text Interpretation: Sinus rhythm Probable left atrial enlargement Confirmed by Simon Rea [phone]) on 05/12/2024 8:13:17 AM  Radiology: US  Abdomen Limited RUQ (LIVER/GB) Result Date: 05/12/2024 CLINICAL DATA:  [id number] Abdominal pain [id number] EXAM: ULTRASOUND ABDOMEN LIMITED RIGHT UPPER QUADRANT COMPARISON:  None Available. FINDINGS: Gallbladder: Multiple small gallstones. Nonshadowing echogenicities along the gallbladder wall measuring up to 7 mm. Mild gallbladder wall thickening measuring up to 4 mm. Trace pericholecystic fluid along the gallbladder fundus. Unable to evaluate for a sonographic Murphy's sign as the patient received pain medication prior to the exam for patient comfort. Common bile duct: Diameter: 3 mm Liver: Increased echogenicity. Focal fatty sparing along the gallbladder fossa. No focal lesion identified. No intrahepatic biliary ductal dilation. Portal vein is patent on color Doppler imaging with normal direction of blood flow towards the liver. Right Kidney: Partially visualized. No mass. No hydronephrosis or nephrolithiasis. Other: None. IMPRESSION: 1. Cholecystolithiasis. Mild gallbladder wall thickening with trace pericholecystic fluid, which may represent changes of early acute cholecystitis. A nuclear medicine hepatobiliary scan is recommended for further characterization. 2. Multiple nonshadowing, nonmobile echogenicities along the gallbladder wall measuring up to 7 mm, possibly adherent biliary sludge or gallbladder polyps. Follow-up is  recommended, as documented below. 3. Hepatic steatosis. Polyp measuring 0.6 to 0.9 cm: -No risk factors for gallbladder malignancy: Follow-up ultrasound at 6 months, and then annually for 2 years. Any increase in size > 0.2 cm, consider cholecystectomy. Follow-up should be discontinued after 2 years, in the absence of growth. -Risk factors for gallbladder malignancy: Consider cholecystectomy. If cholecystectomy is not deemed appropriate, then sonographic follow-up as above. RECOMMENDATIONS:As per the 2021 joint European Society guidelines, suggested management of gallbladder polyp(s) are as follows: *NB: Risk factors for gallbladder malignancy include: > 58 years old, history of underlying primary sclerosing cholangitis, Asian ethnicity, sessile polyp (including focal wall thickening > 0.4 cm). Electronically Signed   By: Rogelia Myers M.D.   On: 05/12/2024 10:00   CT ABDOMEN PELVIS W CONTRAST Result Date: 05/12/2024 CLINICAL DATA:  Right upper quadrant abdominal pain since last night with nausea and loose stools. EXAM: CT ABDOMEN AND PELVIS WITH CONTRAST TECHNIQUE: Multidetector  CT imaging of the abdomen and pelvis was performed using the standard protocol following bolus administration of intravenous contrast. RADIATION DOSE REDUCTION: This exam was performed according to the departmental dose-optimization program which includes automated exposure control, adjustment of the mA and/or kV according to patient size and/or use of iterative reconstruction technique. CONTRAST:  OMNIPAQUE  IOHEXOL  300 MG/ML  SOLN COMPARISON:  Limited right upper quadrant abdomen ultrasound dated 12/22/2019. Abdomen and pelvis CT dated 08/01/2023. FINDINGS: Lower chest: Mildly enlarged heart. Small amount of pericardial fluid without significant change. Minimal bibasilar atelectasis. 3 mm left lower lobe nodule on image number 9/9, more easily seen on today's 2 mm thick images compared to the previous 5 mm thick images on  08/01/2023, unchanged in size and shape. Hepatobiliary: Diffuse low density of the liver. Multiple gallstones in the gallbladder measuring up to 6 mm in maximum diameter each. No gallbladder wall thickening or pericholecystic fluid. Pancreas: Unremarkable. No pancreatic ductal dilatation or surrounding inflammatory changes. Spleen: Normal in size and shape. Interval multiple small cystic areas in the inferior spleen. Adrenals/Urinary Tract: Normal-appearing adrenal glands. Small simple appearing left renal cysts not needing imaging follow-up. Stable four 2 mm nonobstructing left renal calculi. Stable 1 mm nonobstructing right renal calculus. No bladder or ureteral calculi no hydronephrosis. Mild diffuse bladder wall thickening with improvement. Stomach/Bowel: Unremarkable stomach, small bowel and colon. Surgically absent appendix. Vascular/Lymphatic: Mild atheromatous arterial calcifications without aneurysm. No enlarged lymph nodes. Reproductive: Normal-sized prostate gland containing multiple radiation seed implants. Other: Small bilateral inguinal hernias containing fat. Musculoskeletal: Right femoral head avascular necrosis. Mild lumbar and lower thoracic spine degenerative changes. IMPRESSION: 1. No acute abnormality. 2. Cholelithiasis without evidence of cholecystitis. 3. Diffuse hepatic steatosis. 4. Stable small bilateral nonobstructing renal calculi. 5. Mild diffuse bladder wall thickening with improvement, compatible with chronic bladder outlet obstruction. 6. Right femoral head avascular necrosis. 7. Stable 3 mm left lower lobe nodule. No follow-up needed unless clinically indicated. Electronically Signed   By: Elspeth Bathe M.D.   On: 05/12/2024 08:48     Procedures   Medications Ordered in the ED  metroNIDAZOLE (FLAGYL) IVPB 500 mg (500 mg Intravenous New Bag/Given 05/12/24 1113)  ondansetron  (ZOFRAN ) injection 4 mg (4 mg Intravenous Given 05/12/24 0804)  sodium chloride  0.9 % bolus 1,000 mL (0  mLs Intravenous Stopped 05/12/24 1022)  HYDROmorphone  (DILAUDID ) injection 0.5 mg (0.5 mg Intravenous Given 05/12/24 0804)  iohexol  (OMNIPAQUE ) 300 MG/ML solution 100 mL (100 mLs Intravenous Contrast Given 05/12/24 0819)  HYDROmorphone  (DILAUDID ) injection 0.5 mg (0.5 mg Intravenous Given 05/12/24 1022)  cefTRIAXone  (ROCEPHIN ) 1 g in sodium chloride  0.9 % 100 mL IVPB (0 g Intravenous Stopped 05/12/24 1125)  cefTRIAXone  (ROCEPHIN ) 1 g in sodium chloride  0.9 % 100 mL IVPB (0 g Intravenous Stopped 05/12/24 1125)  0.9 %  sodium chloride  infusion ( Intravenous New Bag/Given 05/12/24 1145)    Clinical Course as of 05/12/24 1204  Tue May 12, 2024  1119 General Surgery: [place]  [TL]    Clinical Course User Index [TL] Simon Lavonia SAILOR, MD                                 Medical Decision Making Amount and/or Complexity of Data Reviewed Labs: ordered. Radiology: ordered.  Risk Prescription drug management.     HPI:   Patient presents because of right side abdominal pain.  Patient states started with right-sided abdominal pain right upper quadrant yesterday  around 5 PM.  Subsequently had ongoing pain in this area.  Some nausea but no vomiting.  No fever no chills.  Bowel movements are been regular.  Previous surgeries include appendectomy.  Has never had pain in this area before.  Denies any chest pain or shortness of breath.  No pleuritic chest pain.     Previous medical history reviewed : Follows up with vascular surgery in setting of history of coronary artery stenosis. MDM:    Upon exam, patient hypertensive otherwise hemodynamically stable.  Afebrile.  No tachycardia.  Patient has pain to palpation of right upper quadrant.  Some slight guarding.   Initially obtain laboratory workup.  Wanted screen for any kind of evidence of pancreatitis based off location of pain also wanted to evaluate for any kind evidence of LFT disruption that could be indicative of choledocholithiasis.   Patient did have significant leukocytosis.  Lipase normal.  LFTs normal.  Small anion gap and small acidosis based off bicarb of 21.  Initially obtain CT scan.  CT scan somewhat equivocal.  Showed cholelithiasis but no evidence of direct cholecystitis.   Reevaluated patient.  Still having quite a bit of tenderness of this right upper quadrant.  Positive Murphy sign.  Seems pretty consistent for possible cholecystitis and especially given the leukocytosis, I did obtain a ultrasound of the right upper quadrant.  Rapid called and showed possible early changes of cholecystitis which I think is very consistent with patient's presentation here.  Given this, started patient on Flagyl as well as ceftriaxone .  Also received a liter of fluid here in the ED.  Will be placed on maintenance IV fluid  Surgery consult. Patient will be going to [facility] long Pre Op for planned cholecystectomy under Dr. Polly.    Patient only taken aspirin  as a blood thinner.  Had a few sips of water this morning with medication but otherwise has not had any p.o. intake.   On telemetry, patient's has PVCs but patient's asymptomatic from the standpoint.   CT scan also did show avascular necrosis of the right hip.  No long-term steroid use.  Asymptomatic from this right hip perspective.  Can follow-up with orthopedic surgery outpatient.  Discussed with the patient and wife   Interventions: Ceftriaxone , flagyul, bolus NS   EKG Interpreted by Me: NSR   Cardiac Tele Interpreted by Me: NSR with pVC   I have independently interpreted the u/s  and CT  images and agree with the radiologist finding   Social Determinant of Health: None    Disposition and Follow Up: Will be transferred for cholecystectomy to [facility]       Final diagnoses:  Cholecystitis  Right upper quadrant abdominal pain  Leukocytosis, unspecified type    ED Discharge Orders     None          Simon Lavonia SAILOR, MD 05/12/24 1205    Simon Lavonia SAILOR, MD 05/12/24 1351

## 2024-05-12 NOTE — Discharge Instructions (Addendum)
 Home Care After Gallbladder Removal  Activity  Limit activity for the first 24 hours, then you may return to normal daily activities. Returning to normal daily activities as soon as you can following surgery will enhance recovery time.  No heavy lifting pushing or pulling, anything heavier than 10 pounds (gallon of milk weighs approx. 8.8 pounds) for 2 weeks from surgery date.  Do not mow the lawn, use a vacuum cleaner, or do any other strenuous activities without first consulting your surgical team.  Climb stairs slowly and watch your step.  Walk as often as you feel able to increase strength and endurance.  No driving or operating heavy machinery within 24 hours of taking narcotic pain medication.  Diet  Drink plenty of fluids and eat a light meal on the night of surgery. Some patients may find their appetite is poor for a week or two after surgery. This is a normal result of the stress of surgery-your appetite will return in time.   There are no specific diet restrictions after surgery and you resume regular diet as tolerated. However, you may want to refrain from fatty, heavy, and/or greasy foods and follow a low fat diet for 2-4 weeks to avoid temporary diarrhea and nausea. Slowly add fats back into diet.   If you have diarrhea, try avoiding spicy foods, dairy products, fatty foods, and alcohol . You can also watch to see if specific foods cause it, and stop eating them. If the diarrhea continues for more than 2 weeks, talk to your doctor.  Dressings and Wound Care  Keep your wound or incision site clean and dry.  You may have different types of dressings covering your incisions depending on your operation and your surgeon: Dermabond/Durabond (skin glue): This will usually remain in place for 10-14 days, then naturally fall off your skin. You may take a shower 24 hrs after surgery, carefully wash, not scrub the incision site with a mild non-scented soap. Pat dry with a soft towel.  Do not pick or  peel skin glue off.  You can shower and let the water fall on the dressings above. Do not soak or submerge your incision(s) in a bath tub, hot tub, or swimming pool, until your doctor says it is ok to do so or the incision(s) have completely healed, usually about 2-4 weeks.  Do not use creams, powder, salves or balms on your incision(s).  What to Expect After Surgery  Moderate discomfort controlled with medications  You may feel pain in one or both shoulders. This pain comes from the gas still left in your belly after the surgery, if you had laparoscopic surgery (several small incisions). The pain should ease over several days to a week. Ambulation will help with this pain.   Minimal drainage from incision  Belly swelling  Feeling fatigue and weak  Loose stools after eating. This may occur for 4-8 weeks; however longer in some cases.   Constipation after surgery is common. Drink plenty fluids and eat a high fiber diet.   Pain Control: Prescribed Non-Narcotic Pain Medication  You will be given three prescriptions.  Two of them will be for prescription strength ibuprofen (i.e. Advil) and prescription strength acetaminophen  (i.e. Tylenol ).  The vast majority of patients will just need these two medications.  One prescription will be for a 'rescue' prescription of an oral narcotic (oxycodone ).  You may fill this if needed.  You will alternate taking the ibuprofen (600mg ) every 6 hours and also the acetaminophen  (650mg )  every 6 hours so that you are taking one of those medications every 3 hours.  For example: o 0800 - take ibuprofen 600mg  o 1100 - take acetaminophen  650mg  o 1400 - take ibuprofen 600mg  o 1700 - take acetaminophen  650mg  o Etc.  Continue taking this alternating pattern of ibuprofen and acetaminophen  for 3 days  If you cannot take one or the other of these medications, just take the one you can every 6 hours.  If you are comfortable at night, you don't have to wake up and take a  medication.  If you are still uncomfortable after taking either ibuprofen or acetaminophen , try gentle stretching exercise and ice packs (a bag of frozen vegetables works great).  If you are still uncomfortable, you may fill the narcotic prescription of Oxycodone  and take as directed.  Once you have completed these prescriptions, your pain level should be low enough to stop taking medications altogether or just use an over the counter medication (ibuprofen or acetaminophen ) as needed.   Pain Control: Over the Counter Medications to take as needed  Colace/Docusate: May be prescribed by your surgeon to prevent constipation caused by the combination of narcotics, effects of anesthesia, and decreased ambulation.  Hold for loose stools or diarrhea. Take 100 mg 1-2 times a day starting tonight.   Fiber: High fiber foods, extra liquids (water 9-13 cups/day) can also assist with constipation. Examples of high fiber foods are fruit, bran. Prune juice and water are also good liquids to drink.  Milk of Magnesia/Miralax :  If constipated despite take the over the counter stool softeners, you may take Milk of Magnesia or Miralax  as directed on bottle to assist with constipation.     Pepcid/Famotidine: May be prescribed while taking naproxen (Aleve) or other NSAIDs such as ibuprofen (Motrin/Advil) to prevent stomach upset or Acid-reflux symptoms. Take 1 tablet 1-2 times a day.   **Constipation: The first bowel movement may occur anywhere between 1-5 days after surgery.  As long as you are not nauseated or not having significant abdominal pain this variation is acceptable. Narcotic pain medications can cause constipation increasing discomfort; early discontinuation will assist with bowel management.If constipated despite taking stool softeners,  you may take Milk of Magnesia or Miralax  as directed on the bottle.     **Home medications: You may restart your home medications as directed by your respective Primary Care  Physician or Surgeon.   When to notify your Doctor or Healthcare Team  Sign of Wound Infection   Fever over 100 degrees.  Wound becomes extremely swollen, shows red streaks, warm to the touch, and/or drainage from the incision site or foul-smelling drainage.  Wound edges separate or opens up  Bleeding or bruising   If you have bleeding, apply pressure to the site and hold the pressure firmly for 5 minutes. If the bleeding continues, apply pressure again and call 911. If the bleeding stopped, call your doctor to report it.   Call your doctor or nurse if you have increased bleeding from your site and increased bruising or a lump forms or gets larger under your skin at the site.  Unrelieved Pain    Call your doctor or nurse if your pain gets worse or is not eased 1 hour after taking your pain medicine, or if it is severe and uncontrolled.  Fever, Flu-like symptoms   Fever over 100 degrees and/or chills  Gastrointestinal Symptoms    If you have yellowing of your eyes or skin (jaundice)  If you have  dark or rust-colored urine  If your stool is gray colored   If you have nausea and vomiting that continues more than 24 hours, will not let you keep medicine down and will not let you keep fluids down  Black tarry bowel movements. This can be normal after surgery on the stomach, but should resolve in a day or two.    Call 911 if you suddenly have signs of blood loss such as:  Vomiting blood  Fast heart rate  Feeling faint, sweaty, or blacking out  Passing bright red blood from your rectum  Blood Clot Symptoms   Tender, swollen or reddened areas in your calf muscle or thighs.  Numbness or tingling in your lower leg or calf, or at the top of your leg or groin  Skin on your leg looks pale or blue or feels cold to touch  Chest pain or have trouble breathing, lightheadedness, fast heart rate  Sudden Onset of Symptoms    Call 911 if you suddenly have:  Leg weakness and spasm  Loss of bladder  or bowel function  Seizure  Confusion, severe headache, dizziness or feeling unsteady, problems talking, difficulty swallowing, and/or numbness or muscle weakness as these could be signs of a stroke.   Follow up Appointment Your follow up appointment should be scheduled 2-3 weeks after your surgery date.  If you have not previously scheduled for a follow-up visit you can be scheduled by contacting [phone]

## 2024-05-12 NOTE — Transfer of Care (Signed)
 Immediate Anesthesia Transfer of Care Note  Patient: Kyle English [facility]  Procedure(s) Performed: Procedure(s) with comments: LAPAROSCOPIC CHOLECYSTECTOMY (N/A) - WITH ICG  Patient Location: PACU  Anesthesia Type:General  Level of Consciousness:  sedated, patient cooperative and responds to stimulation  Airway & Oxygen Therapy:Patient Spontanous Breathing and Patient connected to face mask oxgen  Post-op Assessment:  Report given to PACU RN and Post -op Vital signs reviewed and stable  Post vital signs:  Reviewed and stable  Last Vitals:  Vitals:   05/12/24 1145 05/12/24 1329  BP:  (!) 172/94  Pulse:  64  Resp:  18  Temp: 37.6 C (!) 36.1 C  SpO2:  97%    Complications: No apparent anesthesia complications

## 2024-05-12 NOTE — H&P (Incomplete)
 Kyle English [facility] 16-Sep-1950  [id number].    Requesting MD: Dr. Lavonia Pat Chief Complaint/Reason for Consult: Acute Cholecystitis  HPI: Kyle English is a 73 y.o. male with a hx of b/l carotid stenosis s/p CEA on baby ASA, HTN, HLD, OSA on CPAP who presented to [facility] ED w/ abdominal pain. Patient reports RUQ abdominal pain with associated nausea since yesterday at 5pm. No fever, vomiting. No hx of similar pain in the past. Pain is *** after PO intake. Workup was concerning for acute cholecystitis and general surgery was asked to see. He has a hx of prior appendectomy and UHR. He is not on blood thinners except for baby ASA. Last PO intake was ***.   ROS: ROS As above, see hpi  Family History  Problem Relation Age of Onset   Diabetes Mother    Hypertension Mother    Prostate cancer Father        died at the age of 36   Hypertension Sister    CVA Maternal Grandmother 83   Cancer Maternal Uncle        Uncertain   Breast cancer Neg Hx    Colon cancer Neg Hx    Pancreatic cancer Neg Hx     Past Medical History:  Diagnosis Date   Arthritis    Carotid stenosis, bilateral followed by vascular-- dr sheree   s/p  right CEA 04-26-2016;  last carotid duplex 08-17-2019 in epic,  patent right CEA, left ICA 1-39%, bilateral ECA ,50%   Cluster headaches    cluster headaches--- takes Verapamil    Elevated LFTs    followed by pcp---   had abd. ultrasound in epic 12-22-2019   GERD (gastroesophageal reflux disease)    History of CEA (carotid endarterectomy) 04/23/2016   right side  ---- followed by vascular , dr sheree   History of kidney stones    Hypertension    followed by pcp   (12-24-2019 per pt never had a stress test)   Mixed hyperlipidemia    Nephrolithiasis    Nocturia    OSA on CPAP    Prostate cancer [facility]) urologist-- dr bell/  oncologist--- dr patrcia   dx 09-03-2019,  Stage T1c, Gleason 4+4   Vitamin D deficiency     Past Surgical History:  Procedure Laterality  Date   ANTERIOR CERVICAL DECOMP/DISCECTOMY FUSION  11-19-2003  @[facility]    C5 -- 7   APPENDECTOMY  AGE 52   COLONOSCOPY  09/2019   CYSTOSCOPY/RETROGRADE/URETEROSCOPY/STONE EXTRACTION WITH BASKET  1990s   ENDARTERECTOMY Right 04/23/2016   Procedure: RIGHT CAROTID ARTERY ENDARTERECTOMY;  Surgeon: Penne Lonni sheree, MD;  Location: [facility] OR;  Service: Vascular;  Laterality: Right;   INGUINAL HERNIA REPAIR Right 10/05/2008   @[facility]    PATCH ANGIOPLASTY Right 04/23/2016   Procedure: WITH 1 cm x 6 cm XENOSURE PATCH ANGIOPLASTY;  Surgeon: Penne Lonni sheree, MD;  Location: [facility] OR;  Service: Vascular;  Laterality: Right;   PROSTATE BIOPSY     RADIOACTIVE SEED IMPLANT N/A 12/30/2019   Procedure: RADIOACTIVE SEED IMPLANT/BRACHYTHERAPY IMPLANT;  Surgeon: Carolee Sherwood BIRCH DOUGLAS, MD;  Location: [facility];  Service: Urology;  Laterality: N/A;   SPACE OAR INSTILLATION N/A 12/30/2019   Procedure: SPACE OAR INSTILLATION;  Surgeon: Carolee Sherwood BIRCH DOUGLAS, MD;  Location: [facility];  Service: Urology;  Laterality: N/A;   UMBILICAL HERNIA REPAIR  11-06-2007  @[facility]     Social History:  reports that he has never smoked. He has  never been exposed to tobacco smoke. He has never used smokeless tobacco. He reports that he does not currently use alcohol. He reports that he does not use drugs.  Allergies: No Known Allergies  (Not in a hospital admission)    Physical Exam: Blood pressure (!) 171/83, pulse 78, temperature 97.7 F (36.5 C), temperature source Oral, resp. rate 18, SpO2 97%. *** General: pleasant, WD/WN ***male who is laying in bed in NAD HEENT: head is normocephalic, atraumatic.  Sclera are non-icteric. Ears and nose without any obvious masses or lesions.  Mouth is pink and moist. Dentition fair*** Heart: regular, rate, and rhythm.   Lungs: CTAB, no wheezes, rhonchi, or rales noted.  Respiratory effort nonlabored Abd: *** Soft, ND, NT, +BS. No masses, hernias, or organomegaly MS:  no BUE or BLE edema Skin: warm and dry  Psych: A&Ox4 with an appropriate affect*** Neuro: normal speech, thought process intact, moves all extremities, gait not assessed***   Results for orders placed or performed during the hospital encounter of 05/12/24 (from the past 48 hours)  Lipase, blood     Status: None   Collection Time: 05/12/24  7:27 AM  Result Value Ref Range   Lipase 38 11 - 51 U/L    Comment: Performed at [facility], [address] [place], [place] [address]  Comprehensive metabolic panel     Status: Abnormal   Collection Time: 05/12/24  7:27 AM  Result Value Ref Range   Sodium 138 135 - 145 mmol/L   Potassium 4.1 3.5 - 5.1 mmol/L   Chloride 101 98 - 111 mmol/L   CO2 21 (L) 22 - 32 mmol/L   Glucose, Bld 140 (H) 70 - 99 mg/dL    Comment: Glucose reference range applies only to samples taken after fasting for at least 8 hours.   BUN 15 8 - 23 mg/dL   Creatinine, Ser 8.93 0.61 - 1.24 mg/dL   Calcium  9.2 8.9 - 10.3 mg/dL   Total Protein 7.2 6.5 - 8.1 g/dL   Albumin 4.5 3.5 - 5.0 g/dL   AST 24 15 - 41 U/L   ALT 26 0 - 44 U/L   Alkaline Phosphatase 89 38 - 126 U/L   Total Bilirubin 0.6 0.0 - 1.2 mg/dL   GFR, Estimated >39 >39 mL/min    Comment: (NOTE) Calculated using the CKD-EPI Creatinine Equation (2021)    Anion gap 16 (H) 5 - 15    Comment: Performed at [facility], [address] [place], [place] [address]  CBC     Status: Abnormal   Collection Time: 05/12/24  7:27 AM  Result Value Ref Range   WBC 18.8 (H) 4.0 - 10.5 K/uL   RBC 4.35 4.22 - 5.81 MIL/uL   Hemoglobin 13.8 13.0 - 17.0 g/dL   HCT 60.1 60.9 - 47.9 %   MCV 91.5 80.0 - 100.0 fL   MCH 31.7 26.0 - 34.0 pg   MCHC 34.7 30.0 - 36.0 g/dL   RDW 86.3 88.4 - 84.4 %   Platelets 165 150 - 400 K/uL   nRBC 0.0 0.0 - 0.2 %    Comment: Performed at [facility], [address] [place], [place] [address]  Urinalysis, Routine w reflex microscopic -Urine, Clean Catch      Status: Abnormal   Collection Time: 05/12/24  7:27 AM  Result Value Ref Range   Color, Urine YELLOW YELLOW   APPearance CLEAR CLEAR   Specific  Gravity, Urine 1.025 1.005 - 1.030   pH 6.5 5.0 - 8.0   Glucose, UA NEGATIVE NEGATIVE mg/dL   Hgb urine dipstick NEGATIVE NEGATIVE   Bilirubin Urine NEGATIVE NEGATIVE   Ketones, ur NEGATIVE NEGATIVE mg/dL   Protein, ur 30 (A) NEGATIVE mg/dL   Nitrite NEGATIVE NEGATIVE   Leukocytes,Ua NEGATIVE NEGATIVE    Comment: Performed at [facility], [address] [place], [place] [address]  Urinalysis, Microscopic (reflex)     Status: Abnormal   Collection Time: 05/12/24  7:27 AM  Result Value Ref Range   RBC / HPF 0-5 0 - 5 RBC/hpf   WBC, UA 0-5 0 - 5 WBC/hpf   Bacteria, UA RARE (A) NONE SEEN   Squamous Epithelial / HPF 0-5 0 - 5 /HPF    Comment: Performed at [facility], [address], [place], [place] [address]   US  Abdomen Limited RUQ (LIVER/GB) Result Date: 05/12/2024 CLINICAL DATA:  [id number] Abdominal pain [id number] EXAM: ULTRASOUND ABDOMEN LIMITED RIGHT UPPER QUADRANT COMPARISON:  None Available. FINDINGS: Gallbladder: Multiple small gallstones. Nonshadowing echogenicities along the gallbladder wall measuring up to 7 mm. Mild gallbladder wall thickening measuring up to 4 mm. Trace pericholecystic fluid along the gallbladder fundus. Unable to evaluate for a sonographic Murphy's sign as the patient received pain medication prior to the exam for patient comfort. Common bile duct: Diameter: 3 mm Liver: Increased echogenicity. Focal fatty sparing along the gallbladder fossa. No focal lesion identified. No intrahepatic biliary ductal dilation. Portal vein is patent on color Doppler imaging with normal direction of blood flow towards the liver. Right Kidney: Partially visualized. No mass. No hydronephrosis or nephrolithiasis. Other: None. IMPRESSION: 1. Cholecystolithiasis. Mild gallbladder wall thickening with trace pericholecystic fluid,  which may represent changes of early acute cholecystitis. A nuclear medicine hepatobiliary scan is recommended for further characterization. 2. Multiple nonshadowing, nonmobile echogenicities along the gallbladder wall measuring up to 7 mm, possibly adherent biliary sludge or gallbladder polyps. Follow-up is recommended, as documented below. 3. Hepatic steatosis. Polyp measuring 0.6 to 0.9 cm: -No risk factors for gallbladder malignancy: Follow-up ultrasound at 6 months, and then annually for 2 years. Any increase in size > 0.2 cm, consider cholecystectomy. Follow-up should be discontinued after 2 years, in the absence of growth. -Risk factors for gallbladder malignancy: Consider cholecystectomy. If cholecystectomy is not deemed appropriate, then sonographic follow-up as above. RECOMMENDATIONS:As per the 2021 joint European Society guidelines, suggested management of gallbladder polyp(s) are as follows: *NB: Risk factors for gallbladder malignancy include: > 58 years old, history of underlying primary sclerosing cholangitis, Asian ethnicity, sessile polyp (including focal wall thickening > 0.4 cm). Electronically Signed   By: Rogelia Myers M.D.   On: 05/12/2024 10:00   CT ABDOMEN PELVIS W CONTRAST Result Date: 05/12/2024 CLINICAL DATA:  Right upper quadrant abdominal pain since last night with nausea and loose stools. EXAM: CT ABDOMEN AND PELVIS WITH CONTRAST TECHNIQUE: Multidetector CT imaging of the abdomen and pelvis was performed using the standard protocol following bolus administration of intravenous contrast. RADIATION DOSE REDUCTION: This exam was performed according to the departmental dose-optimization program which includes automated exposure control, adjustment of the mA and/or kV according to patient size and/or use of iterative reconstruction technique. CONTRAST:  OMNIPAQUE  IOHEXOL  300 MG/ML  SOLN COMPARISON:  Limited right upper quadrant abdomen ultrasound dated 12/22/2019. Abdomen and  pelvis CT dated 08/01/2023. FINDINGS: Lower chest: Mildly enlarged heart. Small amount of pericardial fluid without significant change. Minimal bibasilar  atelectasis. 3 mm left lower lobe nodule on image number 9/9, more easily seen on today's 2 mm thick images compared to the previous 5 mm thick images on 08/01/2023, unchanged in size and shape. Hepatobiliary: Diffuse low density of the liver. Multiple gallstones in the gallbladder measuring up to 6 mm in maximum diameter each. No gallbladder wall thickening or pericholecystic fluid. Pancreas: Unremarkable. No pancreatic ductal dilatation or surrounding inflammatory changes. Spleen: Normal in size and shape. Interval multiple small cystic areas in the inferior spleen. Adrenals/Urinary Tract: Normal-appearing adrenal glands. Small simple appearing left renal cysts not needing imaging follow-up. Stable four 2 mm nonobstructing left renal calculi. Stable 1 mm nonobstructing right renal calculus. No bladder or ureteral calculi no hydronephrosis. Mild diffuse bladder wall thickening with improvement. Stomach/Bowel: Unremarkable stomach, small bowel and colon. Surgically absent appendix. Vascular/Lymphatic: Mild atheromatous arterial calcifications without aneurysm. No enlarged lymph nodes. Reproductive: Normal-sized prostate gland containing multiple radiation seed implants. Other: Small bilateral inguinal hernias containing fat. Musculoskeletal: Right femoral head avascular necrosis. Mild lumbar and lower thoracic spine degenerative changes. IMPRESSION: 1. No acute abnormality. 2. Cholelithiasis without evidence of cholecystitis. 3. Diffuse hepatic steatosis. 4. Stable small bilateral nonobstructing renal calculi. 5. Mild diffuse bladder wall thickening with improvement, compatible with chronic bladder outlet obstruction. 6. Right femoral head avascular necrosis. 7. Stable 3 mm left lower lobe nodule. No follow-up needed unless clinically indicated. Electronically  Signed   By: Elspeth Bathe M.D.   On: 05/12/2024 08:48    Anti-infectives (From admission, onward)    Start     Dose/Rate Route Frequency Ordered Stop   05/12/24 1045  cefTRIAXone  (ROCEPHIN ) 1 g in sodium chloride  0.9 % 100 mL IVPB        1 g 200 mL/hr over 30 Minutes Intravenous  Once 05/12/24 1044 05/12/24 1120   05/12/24 1030  cefTRIAXone  (ROCEPHIN ) 1 g in sodium chloride  0.9 % 100 mL IVPB        1 g 200 mL/hr over 30 Minutes Intravenous  Once 05/12/24 1027 05/12/24 1104   05/12/24 1030  metroNIDAZOLE (FLAGYL) IVPB 500 mg        500 mg 100 mL/hr over 60 Minutes Intravenous  Once 05/12/24 1027         Assessment/Plan Acute Cholecystitis Patient has been seen and examined.  Vitals, labs, imaging and notes reviewed.  This is a 73 year old gentleman who presented with upper abdominal pain and was found to have possible cholecystitis on imaging.  WBC 18.8.  Lipase and LFTs within normal limits.  On exam he has tenderness in the ***.  Suspect acute cholecystitis.  Recommend antibiotics and OR for laparoscopic cholecystectomy. I have explained the procedure, risks, and aftercare of Laparoscopic cholecystectomy.  Risks include but are not limited to anesthesia (MI, CVA, death, prolonged intubation and aspiration), bleeding, infection, wound problems, hernia, bile leak, injury to common bile duct/liver/intestine, possible need for subtotal cholecystectomy or open cholecystectomy, increased risk of DVT/PE and diarrhea post op.   He seems to understand and agrees to proceed.  FEN - NPO for OR VTE - SCDs ID - Rocephin  Foley - None currently.  Dispo - To OR for laparoscopic appendectomy.  Possible discharge from PACU. If requires admission postoperatively we will plan for observation.  I reviewed {Reviewed data:26882::last 24 h vitals and pain scores,last 48 h intake and output,last 24 h labs and trends,last 24 h imaging results}.  This care required {MDM levels:26883} level of medical  decision making.   ***, PA-C [facility] 05/12/2024, 11:24 AM Please see Amion for pager number during day hours 7:00am-4:30pm

## 2024-05-12 NOTE — ED Triage Notes (Signed)
 RUQ abd pain since last night. Nausea, loose stools.  Denies emesis, urinary symptoms.

## 2024-05-12 NOTE — Anesthesia Procedure Notes (Signed)
 Procedure Name: Intubation Date/Time: 05/12/2024 2:20 PM  Performed by: Vincenzo Show, CRNAPre-anesthesia Checklist: Patient identified, Emergency Drugs available, Suction available, Patient being monitored and Timeout performed Patient Re-evaluated:Patient Re-evaluated prior to induction Oxygen Delivery Method: Circle system utilized Preoxygenation: Pre-oxygenation with 100% oxygen Induction Type: IV induction Ventilation: Mask ventilation with difficulty and Oral airway inserted - appropriate to patient size Laryngoscope Size: Mac and 4 Grade View: Grade III Tube type: Oral Tube size: 7.0 mm Number of attempts: 2 Airway Equipment and Method: Stylet and Video-laryngoscopy Placement Confirmation: ETT inserted through vocal cords under direct vision, positive ETCO2, CO2 detector and breath sounds checked- equal and bilateral Secured at: 23 cm Tube secured with: Tape Dental Injury: Teeth and Oropharynx as per pre-operative assessment  Difficulty Due To: Difficulty was anticipated and Difficult Airway- due to reduced neck mobility Comments: ATOI, G3 by Keaton Beichner CRNA, similar view by Zac MD, no passes of ett.   Glide G1 by Zac, easy pass ett.

## 2024-05-12 NOTE — Anesthesia Procedure Notes (Signed)
 Procedure Name: Intubation Date/Time: 05/12/2024 2:10 PM  Performed by: Boone Fess, MDPre-anesthesia Checklist: Patient identified, Patient being monitored, Timeout performed, Emergency Drugs available and Suction available Patient Re-evaluated:Patient Re-evaluated prior to induction Oxygen Delivery Method: Circle system utilized Preoxygenation: Pre-oxygenation with 100% oxygen Induction Type: IV induction Ventilation: Oral airway inserted - appropriate to patient size and Mask ventilation without difficulty Laryngoscope Size: 4 and Glidescope Grade View: Grade I Tube type: Oral Tube size: 7.0 mm Number of attempts: 3 Airway Equipment and Method: Stylet Placement Confirmation: ETT inserted through vocal cords under direct vision, positive ETCO2 and breath sounds checked- equal and bilateral Secured at: 24 cm Tube secured with: Tape Dental Injury: Teeth and Oropharynx as per pre-operative assessment  Difficulty Due To: Difficulty was anticipated and Difficult Airway- due to anterior larynx Comments: First attempt by CRNA utilizing Mac 4 with poor view. Second attempt by MD utilizing Mac 4 with similarly limited view. Third attempt by MD utilizing Glidescope 4 with Grade 1 view, successful intubation.

## 2024-05-12 NOTE — ED Notes (Signed)
 Pt states pain is starting to come back. EDP notified

## 2024-05-12 NOTE — H&P (Signed)
 Kyle English [facility] 21-Mar-1951  [id number].    HPI:  73 y/o M with presented to the ED with abdominal pain and nausea that started last night. He underwent CT and US , which were concerning for cholecystitis. WBC 18. Tbili and LFTs WNL. He is AF and HDS.  ROS: Review of Systems  Constitutional: Negative.   HENT: Negative.    Eyes: Negative.   Respiratory: Negative.    Cardiovascular: Negative.   Gastrointestinal:  Positive for abdominal pain and nausea.  Genitourinary: Negative.   Musculoskeletal: Negative.   Skin: Negative.   Neurological: Negative.   Endo/Heme/Allergies: Negative.   Psychiatric/Behavioral: Negative.      Family History  Problem Relation Age of Onset   Diabetes Mother    Hypertension Mother    Prostate cancer Father        died at the age of 63   Hypertension Sister    CVA Maternal Grandmother 53   Cancer Maternal Uncle        Uncertain   Breast cancer Neg Hx    Colon cancer Neg Hx    Pancreatic cancer Neg Hx     Past Medical History:  Diagnosis Date   Arthritis    Carotid stenosis, bilateral followed by vascular-- dr sheree   s/p  right CEA 04-26-2016;  last carotid duplex 08-17-2019 in epic,  patent right CEA, left ICA 1-39%, bilateral ECA ,50%   Cluster headaches    cluster headaches--- takes Verapamil    Elevated LFTs    followed by pcp---   had abd. ultrasound in epic 12-22-2019   GERD (gastroesophageal reflux disease)    History of CEA (carotid endarterectomy) 04/23/2016   right side  ---- followed by vascular , dr sheree   History of kidney stones    Hypertension    followed by pcp   (12-24-2019 per pt never had a stress test)   Mixed hyperlipidemia    Nephrolithiasis    Nocturia    OSA on CPAP    Prostate cancer [facility]) urologist-- dr bell/  oncologist--- dr patrcia   dx 09-03-2019,  Stage T1c, Gleason 4+4   Vitamin D deficiency     Past Surgical History:  Procedure Laterality Date   ANTERIOR CERVICAL DECOMP/DISCECTOMY FUSION   11-19-2003  @[facility]    C5 -- 7   APPENDECTOMY  AGE 4   COLONOSCOPY  09/2019   CYSTOSCOPY/RETROGRADE/URETEROSCOPY/STONE EXTRACTION WITH BASKET  1990s   ENDARTERECTOMY Right 04/23/2016   Procedure: RIGHT CAROTID ARTERY ENDARTERECTOMY;  Surgeon: Penne Lonni sheree, MD;  Location: [facility] OR;  Service: Vascular;  Laterality: Right;   INGUINAL HERNIA REPAIR Right 10/05/2008   @[facility]    PATCH ANGIOPLASTY Right 04/23/2016   Procedure: WITH 1 cm x 6 cm XENOSURE PATCH ANGIOPLASTY;  Surgeon: Penne Lonni sheree, MD;  Location: [facility] OR;  Service: Vascular;  Laterality: Right;   PROSTATE BIOPSY     RADIOACTIVE SEED IMPLANT N/A 12/30/2019   Procedure: RADIOACTIVE SEED IMPLANT/BRACHYTHERAPY IMPLANT;  Surgeon: Carolee Sherwood BIRCH DOUGLAS, MD;  Location: [facility];  Service: Urology;  Laterality: N/A;   SPACE OAR INSTILLATION N/A 12/30/2019   Procedure: SPACE OAR INSTILLATION;  Surgeon: Carolee Sherwood BIRCH DOUGLAS, MD;  Location: [facility];  Service: Urology;  Laterality: N/A;   UMBILICAL HERNIA REPAIR  11-06-2007  @[facility]     Social History:  reports that he has never smoked. He has never been exposed to tobacco smoke. He has never used smokeless tobacco. He reports that he does  not currently use alcohol. He reports that he does not use drugs.  Allergies: No Known Allergies  Medications Prior to Admission  Medication Sig Dispense Refill   aspirin  81 MG tablet take 1 tablet by mouth once daily at bedtime     atorvastatin  (LIPITOR) 80 MG tablet Take 80 mg by mouth daily.     dutasteride  (AVODART ) 0.5 MG capsule Take 0.5 mg by mouth at bedtime.      Fish Oil-Cholecalciferol (FISH OIL + D3) 1200-1000 MG-UNIT CAPS Take 1 capsule by mouth daily.      fluticasone  (FLONASE ) 50 MCG/ACT nasal spray Place 2 sprays into both nostrils as needed.      Glucos-Chondroit-Hyaluron-MSM (GLUCOSAMINE CHONDROITIN JOINT) TABS Take 1 tablet by mouth in the morning and at bedtime.      Multiple Vitamins-Minerals (MULTI  ADULT GUMMIES) CHEW Chew 2 tablets by mouth daily.      niacin 500 MG tablet Take 500 mg by mouth daily. Taking one tablet once in the morning.     omeprazole  (PRILOSEC  OTC) 20 MG tablet Take 20 mg by mouth daily.      sildenafil (REVATIO) 20 MG tablet Take 20 mg by mouth as needed (ED).      tamsulosin (FLOMAX) 0.4 MG CAPS capsule Take 0.4 mg by mouth daily.     verapamil  (CALAN -SR) 240 MG CR tablet Take 240 mg by mouth daily.       Physical Exam: Blood pressure (!) 154/57, pulse 70, temperature 99.7 F (37.6 C), temperature source Oral, resp. rate 18, SpO2 97%. Gen: male, NAD Abd: soft, non-distended, TTP in the RUQ  Results for orders placed or performed during the hospital encounter of 05/12/24 (from the past 48 hours)  Lipase, blood     Status: None   Collection Time: 05/12/24  7:27 AM  Result Value Ref Range   Lipase 38 11 - 51 U/L    Comment: Performed at [facility], [address] [place], [place] [address]  Comprehensive metabolic panel     Status: Abnormal   Collection Time: 05/12/24  7:27 AM  Result Value Ref Range   Sodium 138 135 - 145 mmol/L   Potassium 4.1 3.5 - 5.1 mmol/L   Chloride 101 98 - 111 mmol/L   CO2 21 (L) 22 - 32 mmol/L   Glucose, Bld 140 (H) 70 - 99 mg/dL    Comment: Glucose reference range applies only to samples taken after fasting for at least 8 hours.   BUN 15 8 - 23 mg/dL   Creatinine, Ser 8.93 0.61 - 1.24 mg/dL   Calcium  9.2 8.9 - 10.3 mg/dL   Total Protein 7.2 6.5 - 8.1 g/dL   Albumin 4.5 3.5 - 5.0 g/dL   AST 24 15 - 41 U/L   ALT 26 0 - 44 U/L   Alkaline Phosphatase 89 38 - 126 U/L   Total Bilirubin 0.6 0.0 - 1.2 mg/dL   GFR, Estimated >39 >39 mL/min    Comment: (NOTE) Calculated using the CKD-EPI Creatinine Equation (2021)    Anion gap 16 (H) 5 - 15    Comment: Performed at [facility], [address] [place], [place] [address]  CBC     Status: Abnormal   Collection Time: 05/12/24  7:27 AM  Result Value Ref  Range   WBC 18.8 (H) 4.0 - 10.5 K/uL   RBC 4.35 4.22 - 5.81 MIL/uL   Hemoglobin 13.8 13.0 - 17.0 g/dL  HCT 39.8 39.0 - 52.0 %   MCV 91.5 80.0 - 100.0 fL   MCH 31.7 26.0 - 34.0 pg   MCHC 34.7 30.0 - 36.0 g/dL   RDW 86.3 88.4 - 84.4 %   Platelets 165 150 - 400 K/uL   nRBC 0.0 0.0 - 0.2 %    Comment: Performed at [facility], [address] [place], [place] [address]  Urinalysis, Routine w reflex microscopic -Urine, Clean Catch     Status: Abnormal   Collection Time: 05/12/24  7:27 AM  Result Value Ref Range   Color, Urine YELLOW YELLOW   APPearance CLEAR CLEAR   Specific Gravity, Urine 1.025 1.005 - 1.030   pH 6.5 5.0 - 8.0   Glucose, UA NEGATIVE NEGATIVE mg/dL   Hgb urine dipstick NEGATIVE NEGATIVE   Bilirubin Urine NEGATIVE NEGATIVE   Ketones, ur NEGATIVE NEGATIVE mg/dL   Protein, ur 30 (A) NEGATIVE mg/dL   Nitrite NEGATIVE NEGATIVE   Leukocytes,Ua NEGATIVE NEGATIVE    Comment: Performed at [facility], [address] [place], [place] [address]  Urinalysis, Microscopic (reflex)     Status: Abnormal   Collection Time: 05/12/24  7:27 AM  Result Value Ref Range   RBC / HPF 0-5 0 - 5 RBC/hpf   WBC, UA 0-5 0 - 5 WBC/hpf   Bacteria, UA RARE (A) NONE SEEN   Squamous Epithelial / HPF 0-5 0 - 5 /HPF    Comment: Performed at [facility], [address] [place], [place] [address]   US  Abdomen Limited RUQ (LIVER/GB) Result Date: 05/12/2024 CLINICAL DATA:  [id number] Abdominal pain [id number] EXAM: ULTRASOUND ABDOMEN LIMITED RIGHT UPPER QUADRANT COMPARISON:  None Available. FINDINGS: Gallbladder: Multiple small gallstones. Nonshadowing echogenicities along the gallbladder wall measuring up to 7 mm. Mild gallbladder wall thickening measuring up to 4 mm. Trace pericholecystic fluid along the gallbladder fundus. Unable to evaluate for a sonographic Murphy's sign as the patient received pain medication prior to the exam for patient comfort. Common bile duct:  Diameter: 3 mm Liver: Increased echogenicity. Focal fatty sparing along the gallbladder fossa. No focal lesion identified. No intrahepatic biliary ductal dilation. Portal vein is patent on color Doppler imaging with normal direction of blood flow towards the liver. Right Kidney: Partially visualized. No mass. No hydronephrosis or nephrolithiasis. Other: None. IMPRESSION: 1. Cholecystolithiasis. Mild gallbladder wall thickening with trace pericholecystic fluid, which may represent changes of early acute cholecystitis. A nuclear medicine hepatobiliary scan is recommended for further characterization. 2. Multiple nonshadowing, nonmobile echogenicities along the gallbladder wall measuring up to 7 mm, possibly adherent biliary sludge or gallbladder polyps. Follow-up is recommended, as documented below. 3. Hepatic steatosis. Polyp measuring 0.6 to 0.9 cm: -No risk factors for gallbladder malignancy: Follow-up ultrasound at 6 months, and then annually for 2 years. Any increase in size > 0.2 cm, consider cholecystectomy. Follow-up should be discontinued after 2 years, in the absence of growth. -Risk factors for gallbladder malignancy: Consider cholecystectomy. If cholecystectomy is not deemed appropriate, then sonographic follow-up as above. RECOMMENDATIONS:As per the 2021 joint European Society guidelines, suggested management of gallbladder polyp(s) are as follows: *NB: Risk factors for gallbladder malignancy include: > 50 years old, history of underlying primary sclerosing cholangitis, Asian ethnicity, sessile polyp (including focal wall thickening > 0.4 cm). Electronically Signed   By: Rogelia Myers M.D.   On: 05/12/2024 10:00   CT ABDOMEN PELVIS W CONTRAST Result Date: 05/12/2024 CLINICAL DATA:  Right upper quadrant  abdominal pain since last night with nausea and loose stools. EXAM: CT ABDOMEN AND PELVIS WITH CONTRAST TECHNIQUE: Multidetector CT imaging of the abdomen and pelvis was performed using the standard  protocol following bolus administration of intravenous contrast. RADIATION DOSE REDUCTION: This exam was performed according to the departmental dose-optimization program which includes automated exposure control, adjustment of the mA and/or kV according to patient size and/or use of iterative reconstruction technique. CONTRAST:  OMNIPAQUE  IOHEXOL  300 MG/ML  SOLN COMPARISON:  Limited right upper quadrant abdomen ultrasound dated 12/22/2019. Abdomen and pelvis CT dated 08/01/2023. FINDINGS: Lower chest: Mildly enlarged heart. Small amount of pericardial fluid without significant change. Minimal bibasilar atelectasis. 3 mm left lower lobe nodule on image number 9/9, more easily seen on today's 2 mm thick images compared to the previous 5 mm thick images on 08/01/2023, unchanged in size and shape. Hepatobiliary: Diffuse low density of the liver. Multiple gallstones in the gallbladder measuring up to 6 mm in maximum diameter each. No gallbladder wall thickening or pericholecystic fluid. Pancreas: Unremarkable. No pancreatic ductal dilatation or surrounding inflammatory changes. Spleen: Normal in size and shape. Interval multiple small cystic areas in the inferior spleen. Adrenals/Urinary Tract: Normal-appearing adrenal glands. Small simple appearing left renal cysts not needing imaging follow-up. Stable four 2 mm nonobstructing left renal calculi. Stable 1 mm nonobstructing right renal calculus. No bladder or ureteral calculi no hydronephrosis. Mild diffuse bladder wall thickening with improvement. Stomach/Bowel: Unremarkable stomach, small bowel and colon. Surgically absent appendix. Vascular/Lymphatic: Mild atheromatous arterial calcifications without aneurysm. No enlarged lymph nodes. Reproductive: Normal-sized prostate gland containing multiple radiation seed implants. Other: Small bilateral inguinal hernias containing fat. Musculoskeletal: Right femoral head avascular necrosis. Mild lumbar and lower thoracic  spine degenerative changes. IMPRESSION: 1. No acute abnormality. 2. Cholelithiasis without evidence of cholecystitis. 3. Diffuse hepatic steatosis. 4. Stable small bilateral nonobstructing renal calculi. 5. Mild diffuse bladder wall thickening with improvement, compatible with chronic bladder outlet obstruction. 6. Right femoral head avascular necrosis. 7. Stable 3 mm left lower lobe nodule. No follow-up needed unless clinically indicated. Electronically Signed   By: Elspeth Bathe M.D.   On: 05/12/2024 08:48    Assessment/Plan 73 y/o M with acute cholecystitis  - Will proceed to the OR. We discussed the alternatives and potential risks of surgery, including but not limited to: bleeding, infection, damage to bowel or surrounding structures, bile leak, retained stone, pancreatitis, damage to the biliary system, and need for additional procedures. All questions were addressed and consent was obtained.    Cordella DELENA Polly Marlis [facility] 05/12/2024, 1:03 PM Please see Amion for pager number during day hours 7:00am-4:30pm or 7:00am -11:30am on weekends

## 2024-05-13 ENCOUNTER — Encounter (HOSPITAL_COMMUNITY): Payer: Self-pay | Admitting: General Surgery

## 2024-05-14 LAB — SURGICAL PATHOLOGY
